# Patient Record
Sex: Female | Born: 1955 | Race: White | Hispanic: No | Smoking: Never smoker
Health system: Southern US, Community
[De-identification: ages and names within clinical notes are randomized; demographics above are authoritative.]

## PROBLEM LIST (undated history)

## (undated) DIAGNOSIS — F988 Other specified behavioral and emotional disorders with onset usually occurring in childhood and adolescence: Secondary | ICD-10-CM

## (undated) DIAGNOSIS — F419 Anxiety disorder, unspecified: Secondary | ICD-10-CM

## (undated) DIAGNOSIS — D649 Anemia, unspecified: Secondary | ICD-10-CM

## (undated) DIAGNOSIS — R51 Headache: Secondary | ICD-10-CM

## (undated) DIAGNOSIS — I341 Nonrheumatic mitral (valve) prolapse: Secondary | ICD-10-CM

## (undated) DIAGNOSIS — T7840XA Allergy, unspecified, initial encounter: Secondary | ICD-10-CM

## (undated) DIAGNOSIS — E079 Disorder of thyroid, unspecified: Secondary | ICD-10-CM

## (undated) HISTORY — DX: Allergy, unspecified, initial encounter: T78.40XA

## (undated) HISTORY — DX: Headache: R51

## (undated) HISTORY — PX: ABDOMINAL HYSTERECTOMY: SHX81

## (undated) HISTORY — DX: Anxiety disorder, unspecified: F41.9

## (undated) HISTORY — DX: Other specified behavioral and emotional disorders with onset usually occurring in childhood and adolescence: F98.8

## (undated) HISTORY — DX: Anemia, unspecified: D64.9

## (undated) HISTORY — DX: Nonrheumatic mitral (valve) prolapse: I34.1

## (undated) HISTORY — DX: Disorder of thyroid, unspecified: E07.9

---

## 1998-05-13 ENCOUNTER — Ambulatory Visit (HOSPITAL_COMMUNITY): Admission: RE | Admit: 1998-05-13 | Discharge: 1998-05-13 | Payer: Self-pay | Admitting: *Deleted

## 1998-08-18 ENCOUNTER — Ambulatory Visit (HOSPITAL_COMMUNITY): Admission: RE | Admit: 1998-08-18 | Discharge: 1998-08-18 | Payer: Self-pay | Admitting: *Deleted

## 1999-04-04 ENCOUNTER — Encounter: Payer: Self-pay | Admitting: *Deleted

## 1999-04-04 ENCOUNTER — Ambulatory Visit (HOSPITAL_COMMUNITY): Admission: RE | Admit: 1999-04-04 | Discharge: 1999-04-04 | Payer: Self-pay | Admitting: *Deleted

## 1999-06-16 ENCOUNTER — Other Ambulatory Visit: Admission: RE | Admit: 1999-06-16 | Discharge: 1999-06-16 | Payer: Self-pay | Admitting: *Deleted

## 2000-05-07 ENCOUNTER — Encounter: Payer: Self-pay | Admitting: *Deleted

## 2000-05-07 ENCOUNTER — Ambulatory Visit (HOSPITAL_COMMUNITY): Admission: RE | Admit: 2000-05-07 | Discharge: 2000-05-07 | Payer: Self-pay | Admitting: *Deleted

## 2000-08-02 ENCOUNTER — Other Ambulatory Visit: Admission: RE | Admit: 2000-08-02 | Discharge: 2000-08-02 | Payer: Self-pay | Admitting: *Deleted

## 2001-05-28 ENCOUNTER — Encounter: Payer: Self-pay | Admitting: *Deleted

## 2001-05-28 ENCOUNTER — Ambulatory Visit (HOSPITAL_COMMUNITY): Admission: RE | Admit: 2001-05-28 | Discharge: 2001-05-28 | Payer: Self-pay | Admitting: *Deleted

## 2001-08-13 ENCOUNTER — Other Ambulatory Visit: Admission: RE | Admit: 2001-08-13 | Discharge: 2001-08-13 | Payer: Self-pay | Admitting: *Deleted

## 2002-07-02 ENCOUNTER — Encounter: Payer: Self-pay | Admitting: *Deleted

## 2002-07-02 ENCOUNTER — Ambulatory Visit (HOSPITAL_COMMUNITY): Admission: RE | Admit: 2002-07-02 | Discharge: 2002-07-02 | Payer: Self-pay | Admitting: *Deleted

## 2002-07-16 ENCOUNTER — Encounter: Payer: Self-pay | Admitting: Family Medicine

## 2002-07-16 ENCOUNTER — Ambulatory Visit (HOSPITAL_COMMUNITY): Admission: RE | Admit: 2002-07-16 | Discharge: 2002-07-16 | Payer: Self-pay | Admitting: Family Medicine

## 2002-07-16 ENCOUNTER — Encounter: Admission: RE | Admit: 2002-07-16 | Discharge: 2002-07-16 | Payer: Self-pay | Admitting: Family Medicine

## 2002-09-16 ENCOUNTER — Other Ambulatory Visit: Admission: RE | Admit: 2002-09-16 | Discharge: 2002-09-16 | Payer: Self-pay | Admitting: *Deleted

## 2003-02-02 ENCOUNTER — Other Ambulatory Visit: Admission: RE | Admit: 2003-02-02 | Discharge: 2003-02-02 | Payer: Self-pay | Admitting: Obstetrics and Gynecology

## 2003-09-17 ENCOUNTER — Encounter: Admission: RE | Admit: 2003-09-17 | Discharge: 2003-09-17 | Payer: Self-pay | Admitting: Obstetrics and Gynecology

## 2003-10-12 ENCOUNTER — Inpatient Hospital Stay (HOSPITAL_COMMUNITY): Admission: RE | Admit: 2003-10-12 | Discharge: 2003-10-14 | Payer: Self-pay | Admitting: Obstetrics and Gynecology

## 2003-10-12 ENCOUNTER — Encounter (INDEPENDENT_AMBULATORY_CARE_PROVIDER_SITE_OTHER): Payer: Self-pay | Admitting: *Deleted

## 2003-10-19 ENCOUNTER — Ambulatory Visit (HOSPITAL_BASED_OUTPATIENT_CLINIC_OR_DEPARTMENT_OTHER): Admission: RE | Admit: 2003-10-19 | Discharge: 2003-10-19 | Payer: Self-pay | Admitting: Urology

## 2004-03-14 ENCOUNTER — Other Ambulatory Visit: Admission: RE | Admit: 2004-03-14 | Discharge: 2004-03-14 | Payer: Self-pay | Admitting: Obstetrics and Gynecology

## 2004-09-19 ENCOUNTER — Ambulatory Visit (HOSPITAL_COMMUNITY): Admission: RE | Admit: 2004-09-19 | Discharge: 2004-09-19 | Payer: Self-pay | Admitting: Obstetrics and Gynecology

## 2005-06-14 ENCOUNTER — Other Ambulatory Visit: Admission: RE | Admit: 2005-06-14 | Discharge: 2005-06-14 | Payer: Self-pay | Admitting: Obstetrics and Gynecology

## 2005-11-08 ENCOUNTER — Other Ambulatory Visit: Admission: RE | Admit: 2005-11-08 | Discharge: 2005-11-08 | Payer: Self-pay | Admitting: Obstetrics and Gynecology

## 2006-01-21 ENCOUNTER — Ambulatory Visit (HOSPITAL_COMMUNITY): Admission: RE | Admit: 2006-01-21 | Discharge: 2006-01-21 | Payer: Self-pay | Admitting: Orthopedic Surgery

## 2006-06-19 ENCOUNTER — Ambulatory Visit (HOSPITAL_COMMUNITY): Admission: RE | Admit: 2006-06-19 | Discharge: 2006-06-19 | Payer: Self-pay | Admitting: Obstetrics and Gynecology

## 2007-03-26 ENCOUNTER — Ambulatory Visit: Payer: Self-pay | Admitting: Family Medicine

## 2007-06-30 ENCOUNTER — Ambulatory Visit: Payer: Self-pay | Admitting: Family Medicine

## 2007-06-30 LAB — CONVERTED CEMR LAB
Albumin: 3.9 g/dL (ref 3.5–5.2)
Alkaline Phosphatase: 64 units/L (ref 39–117)
Basophils Relative: 1 % (ref 0.0–1.0)
Blood in Urine, dipstick: NEGATIVE
Calcium: 8.9 mg/dL (ref 8.4–10.5)
Chloride: 106 meq/L (ref 96–112)
Cholesterol: 208 mg/dL (ref 0–200)
Eosinophils Absolute: 0.2 10*3/uL (ref 0.0–0.6)
Eosinophils Relative: 3.1 % (ref 0.0–5.0)
GFR calc non Af Amer: 111 mL/min
Glucose, Bld: 81 mg/dL (ref 70–99)
HCT: 35.2 % — ABNORMAL LOW (ref 36.0–46.0)
Hemoglobin: 12.4 g/dL (ref 12.0–15.0)
Ketones, urine, test strip: NEGATIVE
Monocytes Relative: 6.6 % (ref 3.0–11.0)
Neutro Abs: 4.2 10*3/uL (ref 1.4–7.7)
Platelets: 251 10*3/uL (ref 150–400)
Protein, U semiquant: NEGATIVE
RBC: 3.71 M/uL — ABNORMAL LOW (ref 3.87–5.11)
RDW: 12.3 % (ref 11.5–14.6)
TSH: 3.23 microintl units/mL (ref 0.35–5.50)
Total Bilirubin: 0.9 mg/dL (ref 0.3–1.2)
Total Protein: 6.7 g/dL (ref 6.0–8.3)
Triglycerides: 59 mg/dL (ref 0–149)
pH: 7

## 2007-07-01 ENCOUNTER — Ambulatory Visit (HOSPITAL_COMMUNITY): Admission: RE | Admit: 2007-07-01 | Discharge: 2007-07-01 | Payer: Self-pay | Admitting: Family Medicine

## 2007-07-07 ENCOUNTER — Ambulatory Visit: Payer: Self-pay | Admitting: Family Medicine

## 2007-07-07 DIAGNOSIS — R51 Headache: Secondary | ICD-10-CM

## 2007-07-07 DIAGNOSIS — J309 Allergic rhinitis, unspecified: Secondary | ICD-10-CM | POA: Insufficient documentation

## 2007-07-07 DIAGNOSIS — D649 Anemia, unspecified: Secondary | ICD-10-CM | POA: Insufficient documentation

## 2007-07-07 DIAGNOSIS — E039 Hypothyroidism, unspecified: Secondary | ICD-10-CM | POA: Insufficient documentation

## 2007-07-07 DIAGNOSIS — F988 Other specified behavioral and emotional disorders with onset usually occurring in childhood and adolescence: Secondary | ICD-10-CM | POA: Insufficient documentation

## 2007-07-07 DIAGNOSIS — R519 Headache, unspecified: Secondary | ICD-10-CM | POA: Insufficient documentation

## 2007-07-08 ENCOUNTER — Encounter (INDEPENDENT_AMBULATORY_CARE_PROVIDER_SITE_OTHER): Payer: Self-pay | Admitting: *Deleted

## 2007-07-28 ENCOUNTER — Telehealth: Payer: Self-pay | Admitting: Family Medicine

## 2007-10-21 ENCOUNTER — Telehealth: Payer: Self-pay | Admitting: Family Medicine

## 2007-12-20 ENCOUNTER — Encounter: Admission: RE | Admit: 2007-12-20 | Discharge: 2007-12-20 | Payer: Self-pay | Admitting: Otolaryngology

## 2008-04-01 ENCOUNTER — Telehealth: Payer: Self-pay | Admitting: *Deleted

## 2008-04-01 ENCOUNTER — Telehealth: Payer: Self-pay | Admitting: Family Medicine

## 2008-07-01 ENCOUNTER — Ambulatory Visit (HOSPITAL_COMMUNITY): Admission: RE | Admit: 2008-07-01 | Discharge: 2008-07-01 | Payer: Self-pay | Admitting: Family Medicine

## 2008-07-21 ENCOUNTER — Telehealth: Payer: Self-pay | Admitting: Family Medicine

## 2008-08-03 ENCOUNTER — Ambulatory Visit: Payer: Self-pay | Admitting: Family Medicine

## 2008-08-03 LAB — CONVERTED CEMR LAB
AST: 22 units/L (ref 0–37)
Albumin: 3.8 g/dL (ref 3.5–5.2)
Alkaline Phosphatase: 52 units/L (ref 39–117)
Basophils Absolute: 0 10*3/uL (ref 0.0–0.1)
Bilirubin Urine: NEGATIVE
Bilirubin, Direct: 0.2 mg/dL (ref 0.0–0.3)
Calcium: 9.1 mg/dL (ref 8.4–10.5)
Chloride: 105 meq/L (ref 96–112)
Glucose, Bld: 93 mg/dL (ref 70–99)
HCT: 35.6 % — ABNORMAL LOW (ref 36.0–46.0)
Hemoglobin: 12.1 g/dL (ref 12.0–15.0)
LDL Cholesterol: 108 mg/dL — ABNORMAL HIGH (ref 0–99)
Monocytes Absolute: 0.6 10*3/uL (ref 0.1–1.0)
Neutro Abs: 4.6 10*3/uL (ref 1.4–7.7)
Nitrite: NEGATIVE
Potassium: 3.9 meq/L (ref 3.5–5.1)
RBC: 3.73 M/uL — ABNORMAL LOW (ref 3.87–5.11)
Specific Gravity, Urine: 1.015 (ref 1.000–1.03)
Total Bilirubin: 0.9 mg/dL (ref 0.3–1.2)
Urine Glucose: NEGATIVE mg/dL
Urobilinogen, UA: 0.2 (ref 0.0–1.0)
WBC: 8 10*3/uL (ref 4.5–10.5)

## 2008-08-10 ENCOUNTER — Ambulatory Visit: Payer: Self-pay | Admitting: Family Medicine

## 2008-08-10 DIAGNOSIS — R5381 Other malaise: Secondary | ICD-10-CM | POA: Insufficient documentation

## 2008-09-06 ENCOUNTER — Ambulatory Visit: Payer: Self-pay | Admitting: Family Medicine

## 2008-09-07 DIAGNOSIS — F411 Generalized anxiety disorder: Secondary | ICD-10-CM | POA: Insufficient documentation

## 2008-09-07 DIAGNOSIS — R002 Palpitations: Secondary | ICD-10-CM | POA: Insufficient documentation

## 2008-09-24 ENCOUNTER — Telehealth: Payer: Self-pay | Admitting: *Deleted

## 2008-10-11 ENCOUNTER — Encounter: Payer: Self-pay | Admitting: Family Medicine

## 2009-07-12 ENCOUNTER — Ambulatory Visit (HOSPITAL_COMMUNITY): Admission: RE | Admit: 2009-07-12 | Discharge: 2009-07-12 | Payer: Self-pay | Admitting: Obstetrics and Gynecology

## 2009-08-11 ENCOUNTER — Encounter: Payer: Self-pay | Admitting: Family Medicine

## 2009-08-12 ENCOUNTER — Ambulatory Visit: Payer: Self-pay | Admitting: Family Medicine

## 2009-08-12 LAB — CONVERTED CEMR LAB
BUN: 11 mg/dL (ref 6–23)
Basophils Relative: 1 % (ref 0.0–3.0)
Bilirubin, Direct: 0.1 mg/dL (ref 0.0–0.3)
CO2: 31 meq/L (ref 19–32)
Creatinine, Ser: 0.7 mg/dL (ref 0.4–1.2)
Eosinophils Absolute: 0.2 10*3/uL (ref 0.0–0.7)
HDL: 67.6 mg/dL (ref >39.00)
Hemoglobin, Urine: NEGATIVE
Ketones, ur: NEGATIVE mg/dL
Leukocytes, UA: NEGATIVE
Lymphs Abs: 2.6 10*3/uL (ref 0.7–4.0)
MCV: 96.4 fL (ref 78.0–100.0)
Monocytes Relative: 6.5 % (ref 3.0–12.0)
Neutro Abs: 3.2 10*3/uL (ref 1.4–7.7)
Nitrite: NEGATIVE
Platelets: 248 10*3/uL (ref 150.0–400.0)
Potassium: 4.4 meq/L (ref 3.5–5.1)
RBC: 3.76 M/uL — ABNORMAL LOW (ref 3.87–5.11)
RDW: 11.4 % — ABNORMAL LOW (ref 11.5–14.6)
Sodium: 141 meq/L (ref 135–145)
Specific Gravity, Urine: 1.01 (ref 1.000–1.030)
TSH: 4.25 microintl units/mL (ref 0.35–5.50)
Total CHOL/HDL Ratio: 3
Total Protein, Urine: NEGATIVE mg/dL
Total Protein: 7.1 g/dL (ref 6.0–8.3)
Urine Glucose: NEGATIVE mg/dL
Urobilinogen, UA: 0.2 (ref 0.0–1.0)
VLDL: 15.6 mg/dL (ref 0.0–40.0)
WBC: 6.5 10*3/uL (ref 4.5–10.5)

## 2009-08-22 ENCOUNTER — Encounter (INDEPENDENT_AMBULATORY_CARE_PROVIDER_SITE_OTHER): Payer: Self-pay | Admitting: *Deleted

## 2009-08-22 ENCOUNTER — Ambulatory Visit: Payer: Self-pay | Admitting: Family Medicine

## 2009-08-23 ENCOUNTER — Encounter (INDEPENDENT_AMBULATORY_CARE_PROVIDER_SITE_OTHER): Payer: Self-pay | Admitting: *Deleted

## 2009-08-29 ENCOUNTER — Encounter (INDEPENDENT_AMBULATORY_CARE_PROVIDER_SITE_OTHER): Payer: Self-pay | Admitting: *Deleted

## 2009-12-27 ENCOUNTER — Telehealth: Payer: Self-pay | Admitting: Family Medicine

## 2010-01-30 ENCOUNTER — Telehealth: Payer: Self-pay | Admitting: Family Medicine

## 2010-04-13 ENCOUNTER — Telehealth: Payer: Self-pay | Admitting: Family Medicine

## 2010-06-19 ENCOUNTER — Telehealth: Payer: Self-pay | Admitting: Family Medicine

## 2010-06-20 ENCOUNTER — Telehealth: Payer: Self-pay | Admitting: Family Medicine

## 2010-07-03 ENCOUNTER — Telehealth: Payer: Self-pay | Admitting: Family Medicine

## 2010-07-13 ENCOUNTER — Ambulatory Visit (HOSPITAL_COMMUNITY): Admission: RE | Admit: 2010-07-13 | Discharge: 2010-07-13 | Payer: Self-pay | Admitting: Family Medicine

## 2010-08-16 ENCOUNTER — Telehealth: Payer: Self-pay | Admitting: Family Medicine

## 2010-08-28 ENCOUNTER — Ambulatory Visit: Payer: Self-pay | Admitting: Family Medicine

## 2010-08-28 LAB — CONVERTED CEMR LAB
AST: 25 units/L (ref 0–37)
Albumin: 3.9 g/dL (ref 3.5–5.2)
Alkaline Phosphatase: 61 units/L (ref 39–117)
BUN: 16 mg/dL (ref 6–23)
Basophils Absolute: 0 10*3/uL (ref 0.0–0.1)
Basophils Relative: 0.5 % (ref 0.0–3.0)
Calcium: 8.9 mg/dL (ref 8.4–10.5)
Creatinine, Ser: 0.7 mg/dL (ref 0.4–1.2)
Direct LDL: 101.9 mg/dL
Eosinophils Absolute: 0.3 10*3/uL (ref 0.0–0.7)
GFR calc non Af Amer: 88.8 mL/min (ref >60)
Glucose, Bld: 82 mg/dL (ref 70–99)
HCT: 32.4 % — ABNORMAL LOW (ref 36.0–46.0)
Ketones, ur: NEGATIVE mg/dL
Lymphs Abs: 2.8 10*3/uL (ref 0.7–4.0)
MCV: 95.8 fL (ref 78.0–100.0)
Monocytes Absolute: 0.5 10*3/uL (ref 0.1–1.0)
Monocytes Relative: 7.3 % (ref 3.0–12.0)
RDW: 13.2 % (ref 11.5–14.6)
Sodium: 140 meq/L (ref 135–145)
TSH: 3.43 microintl units/mL (ref 0.35–5.50)
Total Bilirubin: 0.9 mg/dL (ref 0.3–1.2)
Total CHOL/HDL Ratio: 3
Total Protein, Urine: NEGATIVE mg/dL
VLDL: 26.2 mg/dL (ref 0.0–40.0)
WBC: 6.9 10*3/uL (ref 4.5–10.5)
pH: 5.5 (ref 5.0–8.0)

## 2010-09-04 ENCOUNTER — Ambulatory Visit: Payer: Self-pay | Admitting: Family Medicine

## 2010-09-04 ENCOUNTER — Encounter: Payer: Self-pay | Admitting: Family Medicine

## 2010-09-06 ENCOUNTER — Encounter: Payer: Self-pay | Admitting: Family Medicine

## 2010-09-07 LAB — CONVERTED CEMR LAB
Folate: 15.5 ng/mL
Saturation Ratios: 22 % (ref 20.0–50.0)
Transferrin: 282.7 mg/dL (ref 212.0–360.0)

## 2010-09-08 ENCOUNTER — Telehealth: Payer: Self-pay | Admitting: Family Medicine

## 2010-10-12 ENCOUNTER — Telehealth: Payer: Self-pay | Admitting: Family Medicine

## 2010-11-07 NOTE — Progress Notes (Signed)
Summary: ritalin refill  Phone Note Refill Request Message from:  Patient on August 16, 2010 9:54 AM  Refills Requested: Medication #1:  RITALIN 10 MG TABS take 2 tabs in the morning and one tab at noon fill in one month. Initial call taken by: Kern Reap CMA Duncan Dull),  August 16, 2010 9:54 AM    Prescriptions: RITALIN 10 MG TABS (METHYLPHENIDATE HCL) take 2 tabs in the morning and one tab at noon fill in one month  #90 x 0   Entered by:   Kern Reap CMA (AAMA)   Authorized by:   Roderick Pee MD   Signed by:   Kern Reap CMA (AAMA) on 08/16/2010   Method used:   Print then Give to Patient   RxID:   [id number]

## 2010-11-07 NOTE — Medication Information (Signed)
Summary: Prior Authorization and Approval for Methylphenidate  Prior Authorization and Approval for Methylphenidate   Imported By: Maryln Gottron 09/08/2010 15:16:40  _____________________________________________________________________  External Attachment:    Type:   Image     Comment:   External Document

## 2010-11-07 NOTE — Progress Notes (Signed)
Summary: REFILL  Phone Note Refill Request Message from:  Fax from Pharmacy  Refills Requested: Medication #1:  LIDEX 0.05 % CREA apply at bedtime. [facility][phone]  Initial call taken by: Warnell Forester,  December 27, 2009 8:46 AM    Prescriptions: LIDEX 0.05 % CREA (FLUOCINONIDE) apply at bedtime  #60 gr x 3   Entered by:   Kern Reap CMA (AAMA)   Authorized by:   Roderick Pee MD   Signed by:   Kern Reap CMA (AAMA) on 12/27/2009   Method used:   Electronically to        [facility]* (mail-order)       [address] [place], [place]  [address]       Ph: [phone]       Fax: [phone]   RxID:   [id number]

## 2010-11-07 NOTE — Progress Notes (Signed)
Summary: ativan refill  Phone Note From Pharmacy   Caller: [facility]* Summary of Call: patient is requesting a refill of ativan.  is this okay to fill? Initial call taken by: Kern Reap CMA Duncan Dull),  April 13, 2010 5:02 PM  Follow-up for Phone Call        .her last physical was in November 2010.......... okay to refill medications until next physical Follow-up by: Roderick Pee MD,  April 14, 2010 8:28 AM  Additional Follow-up for Phone Call Additional follow up Details #1::        rx faxed Additional Follow-up by: Kern Reap CMA Duncan Dull),  April 14, 2010 3:07 PM    Prescriptions: ATIVAN 1 MG  TABS (LORAZEPAM) uad, as needed  #100 x 1   Entered by:   Kern Reap CMA (AAMA)   Authorized by:   Roderick Pee MD   Signed by:   Kern Reap CMA (AAMA) on 04/14/2010   Method used:   Printed then faxed to ...       [facility] (mail-order)       [address] [place], [place]  [address]       Ph: [phone]       Fax: [phone]   RxID:   [id number]

## 2010-11-07 NOTE — Assessment & Plan Note (Signed)
Summary: CPX // RS   Vital Signs:  Patient profile:   55 year old female Height:      64.5 inches Weight:      159 pounds BMI:     26.97 Temp:     98.6 degrees F oral Pulse rate:   76 / minute Pulse rhythm:   regular BP sitting:   112 / 84  (left arm) Cuff size:   regular  Vitals Entered By: Alfred Levins, CMA (September 04, 2010 3:54 PM) CC: cpx, no pap   CC:  cpx and no pap.  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  Sleep dysfunction, and ADD.  She takes Ativan 1 mg at bedtime.  If she does not take that.  She will not sleep.  She also takes her Levoxyl 50 micrograms daily for hypothyroidism.  Her TSH level is 3.43, therefore, continue above dose.  She takes Celexa 20 mg nightly and Corgard 20 mg p.r.n. for palpitations.  She says the palpitations occur about once or twice every couple weeks.  She is content to take it p.r.n. Corgard.  She also takes methylphenidate 10 mg two tabs in the morning one tab at noon.  This helped to focus and concentrate and get through the day.  She also takes over-the-counter antihistamines for allergic rhinitis.  I counseled her about her hormone replacement therapy.  She said this hormone replacement therapy now for about 6 years.  I asked her to consult with a women's health initiative study.........Marland Kitchen bottom line is she should be on hormones for about 3 years and get off pain.  She is intolerant of salicylates.  It makes her itch.  She therefore does not take an aspirin tablet daily.  She gets routine eye care, dental care, BSE monthly, annual mammography, colonoscopy, normal.  Tetanus 2001, and today, seasonal flu shot 2011  Her migraines have virtually disappeared  Current Medications (verified): 1)  Femring 0.05 Mg/24hr  Ring (Estradiol Acetate) .... Change  Every 90 Days 2)  Levoxyl 50 Mcg  Tabs (Levothyroxine Sodium) .Marland Kitchen.. 1 Tab Once Daily; Dispense As Written-No Generic 3)  Mvi 4)  B-Complex .... Take 1 Tablet By Mouth Once A Day 5)  Ca & Mag. 6)  Fexofenadine Hcl 180 Mg  Tabs (Fexofenadine Hcl) .... Take 1 Tablet By Mouth Once A Day 7)  Butalbital-Asa-Caffeine 50-325-40 Mg  Caps (Butalbital-Aspirin-Caffeine) .... Uad, Prn For Migraines 8)  Ativan 1 Mg  Tabs (Lorazepam) .... Uad, As Needed 9)  Celexa 20 Mg Tabs (Citalopram Hydrobromide) .Marland Kitchen.. 1 Tab @ Bedtime 10)  Methylphenidate Hcl 10 Mg Tabs (Methylphenidate Hcl) .... Take Two Tabs in The Morning and One Tab At [facility] in Two Months 11)  Corgard 20 Mg Tabs (Nadolol) .... One Once Daily 12)  Lidex 0.05 % Crea (Fluocinonide) .... Apply At Bedtime 13)  Methylphenidate Hcl 10 Mg Tabs (Methylphenidate Hcl) .... Take 2 Tabs in The Morning and One Tab At [facility] 14)  Ritalin 10 Mg Tabs (Methylphenidate Hcl) .... Take 2 Tabs in The Morning and One Tab At [facility] in One Month  Allergies (verified): 1)  ! Penicillin V Potassium (Penicillin V Potassium)  Past History:  Past medical, surgical, family and social histories (including risk factors) reviewed, and no changes noted (except as noted below).  Past Medical History: Reviewed history from 09/06/2008 and  no changes required. Allergic rhinitis Anemia-NOS Headache Hypothyroidism ADD MVP Anxiety  Past Surgical History: Hysterectomy  Family History: Reviewed history and no changes required.  Social History: Reviewed history from 07/07/2007 and no changes required. Occupation:RN Divorced Never Smoked Alcohol use-no Drug use-no  Review of Systems      See HPI  Physical Exam  General:  Well-developed,well-nourished,in no acute distress; alert,appropriate and cooperative throughout examination Head:  Normocephalic and atraumatic without obvious abnormalities. No apparent alopecia or balding. Eyes:  No corneal or conjunctival  inflammation noted. EOMI. Perrla. Funduscopic exam benign, without hemorrhages, exudates or papilledema. Vision grossly normal. Ears:  External ear exam shows no significant lesions or deformities.  Otoscopic examination reveals clear canals, tympanic membranes are intact bilaterally without bulging, retraction, inflammation or discharge. Hearing is grossly normal bilaterally. Nose:  External nasal examination shows no deformity or inflammation. Nasal mucosa are pink and moist without lesions or exudates. Mouth:  Oral mucosa and oropharynx without lesions or exudates.  Teeth in good repair. Neck:  No deformities, masses, or tenderness noted. Chest Wall:  No deformities, masses, or tenderness noted. Breasts:  No mass, nodules, thickening, tenderness, bulging, retraction, inflamation, nipple discharge or skin changes noted.   Lungs:  Normal respiratory effort, chest expands symmetrically. Lungs are clear to auscultation, no crackles or wheezes. Heart:  Normal rate and regular rhythm. S1 and S2 normal without gallop, murmur, click, rub or other extra sounds. Abdomen:  Bowel sounds positive,abdomen soft and non-tender without masses, organomegaly or hernias noted. Msk:  No deformity or scoliosis noted of thoracic or lumbar spine.   Pulses:  R and L carotid,radial,femoral,dorsalis pedis and posterior tibial pulses are full and equal bilaterally Extremities:  No clubbing, cyanosis, edema, or deformity noted with normal full range of motion of all joints.   Neurologic:  No cranial nerve deficits noted. Station and gait are normal. Plantar reflexes are down-going bilaterally. DTRs are symmetrical throughout. Sensory, motor and coordinative functions appear intact. Skin:  Intact without suspicious lesions or rashes Cervical Nodes:  No lymphadenopathy noted Axillary Nodes:  No palpable lymphadenopathy Inguinal Nodes:  No significant adenopathy Psych:  Cognition and judgment appear intact. Alert and  cooperative with normal attention span and concentration. No apparent delusions, illusions, hallucinations   Impression & Recommendations:  Problem # 1:  PALPITATIONS, OCCASIONAL (ICD-785.1) Assessment Unchanged  Her updated medication list for this problem includes:    Corgard 20 Mg Tabs (Nadolol) ..... One once daily  Orders: Prescription Created Electronically [phone]) EKG w/ Interpretation (93000)  Problem # 2:  ADD (ICD-314.00) Assessment: Improved  Orders: Prescription Created Electronically [phone]) EKG w/ Interpretation (93000)  Problem # 3:  HYPOTHYROIDISM (ICD-244.9) Assessment: Improved  Her updated medication list for this problem includes:    Levoxyl 50 Mcg Tabs (Levothyroxine sodium) .Marland Kitchen... 1 tab once daily; dispense as written-no generic  Orders: Prescription Created Electronically [phone]) EKG w/ Interpretation (93000)  Problem # 4:  HEADACHE (ICD-784.0) Assessment: Improved  Her updated medication list for this problem includes:    Butalbital-asa-caffeine 50-325-40 Mg Caps (Butalbital-aspirin-caffeine) ..... Uad, prn for migraines    Corgard 20 Mg Tabs (Nadolol) ..... One once daily  Orders: Prescription Created Electronically [phone])  Problem # 5:  PHYSICAL EXAMINATION (ICD-V70.0) Assessment: Unchanged  Orders: Prescription Created Electronically [phone])  Complete Medication List: 1)  Femring 0.05 Mg/24hr Ring (Estradiol acetate) .... Change every 90 days 2)  Levoxyl 50 Mcg Tabs (Levothyroxine sodium) .Marland Kitchen.. 1 tab once daily; dispense as written-no generic 3)  Mvi  4)  B-complex  .... Take 1 tablet by mouth once a day 5)  Ca & Mag.  6)  Fexofenadine Hcl 180 Mg Tabs (Fexofenadine hcl) .... Take 1 tablet by mouth once a day 7)  Butalbital-asa-caffeine 50-325-40 Mg Caps (Butalbital-aspirin-caffeine) .... Uad, prn for migraines 8)  Ativan 1 Mg Tabs (Lorazepam) .... Uad, as needed 9)  Celexa 20 Mg Tabs (Citalopram hydrobromide) .Marland Kitchen.. 1 tab @ bedtime 10)   Methylphenidate Hcl 10 Mg Tabs (Methylphenidate hcl) .... Take two tabs in the morning and one tab at noon  fill in two months 11)  Corgard 20 Mg Tabs (Nadolol) .... One once daily 12)  Lidex 0.05 % Crea (Fluocinonide) .... Apply at bedtime 13)  Methylphenidate Hcl 10 Mg Tabs (Methylphenidate hcl) .... Take 2 tabs in the morning and one tab at noon 14)  Ritalin 10 Mg Tabs (Methylphenidate hcl) .... Take 2 tabs in the morning and one tab at noon fill in one month  Other Orders: Venipuncture ([address]) TLB-B12 + Folate Pnl (60454_09811-B14/NWG) TLB-IBC Pnl (Iron/FE;Transferrin) (83550-IBC) Specimen Handling (95621) Tdap => 33yrs IM (30865) Admin 1st Vaccine (78469)  Patient Instructions: 1)  Please schedule a follow-up appointment in 1 year. 2)  It is important that you exercise regularly at least 20 minutes 5 times a week. If you develop chest pain, have severe difficulty breathing, or feel very tired , stop exercising immediately and seek medical attention. 3)  Schedule your mammogram. 4)  Schedule a colonoscopy/sigmoidoscopy to help detect colon cancer. 5)  Take calcium +Vitamin D daily. 6)  Take an Aspirin every day. Prescriptions: RITALIN 10 MG TABS (METHYLPHENIDATE HCL) take 2 tabs in the morning and one tab at noon fill in one month  #90 x 0   Entered and Authorized by:   Roderick Pee MD   Signed by:   Roderick Pee MD on 09/04/2010   Method used:   Print then Give to Patient   RxID:   [id number] METHYLPHENIDATE HCL 10 MG TABS (METHYLPHENIDATE HCL) take 2 tabs in the morning and one tab at noon  #90 x 0   Entered and Authorized by:   Roderick Pee MD   Signed by:   Roderick Pee MD on 09/04/2010   Method used:   Print then Give to Patient   RxID:   [id number] METHYLPHENIDATE HCL 10 MG TABS (METHYLPHENIDATE HCL) take two tabs in the morning and one tab at noon  fill in two months  #90 x 0   Entered and Authorized by:   Roderick Pee MD   Signed by:   Roderick Pee MD on 09/04/2010   Method used:   Print then Give to Patient   RxID:   [id number] ATIVAN 1 MG  TABS (LORAZEPAM) uad, as needed  #100 x 3   Entered and Authorized by:   Roderick Pee MD   Signed by:   Roderick Pee MD on 09/04/2010   Method used:   Print then Give to Patient   RxID:   [id number] CORGARD 20 MG TABS (NADOLOL) one once daily  #50 x 1   Entered and Authorized by:   Roderick Pee MD   Signed by:   Roderick Pee MD on 09/04/2010   Method used:   Electronically to        [facility]* (mail-order)       [address] [place], [place]  [address]  Ph: [phone]       Fax: [phone]   RxID:   [id number] CELEXA 20 MG TABS (CITALOPRAM HYDROBROMIDE) 1 tab @ bedtime  #100 x 3   Entered and Authorized by:   Roderick Pee MD   Signed by:   Roderick Pee MD on 09/04/2010   Method used:   Electronically to        [facility]* (mail-order)       [address] [place], [place]  [address]       Ph: [phone]       Fax: [phone]   RxID:   [id number] LEVOXYL 50 MCG  TABS (LEVOTHYROXINE SODIUM) 1 tab once daily; dispense as written-no generic  #100 x 3   Entered and Authorized by:   Roderick Pee MD   Signed by:   Roderick Pee MD on 09/04/2010   Method used:   Electronically to        [facility]* (mail-order)       [address] [place], [place]  [address]       Ph: [phone]       Fax: [phone]   RxID:   [id number]    Orders Added: 1)  Prescription Created Electronically [G8553] 2)  Est. Patient 40-64 years [99396] 3)  Venipuncture [10626] 4)  TLB-B12 + Folate Pnl [82746_82607-B12/FOL] 5)  TLB-IBC Pnl (Iron/FE;Transferrin) [83550-IBC] 6)  Specimen Handling [99000] 7)  EKG w/ Interpretation [93000] 8)  Tdap => 79yrs IM [90715] 9)  Admin 1st Vaccine [94854]   Immunizations Administered:  Tetanus Vaccine:    Vaccine Type: Tdap    Site: left deltoid     Mfr: GlaxoSmithKline    Dose: 0.5 ml    Route: IM    Given by: Kern Reap CMA (AAMA)    Exp. Date: 07/27/2012    Lot #: [id number]    Physician counseled: yes   Immunizations Administered:  Tetanus Vaccine:    Vaccine Type: Tdap    Site: left deltoid    Mfr: GlaxoSmithKline    Dose: 0.5 ml    Route: IM    Given by: Kern Reap CMA (AAMA)    Exp. Date: 07/27/2012    Lot #: [id number]    Physician counseled: yes

## 2010-11-07 NOTE — Progress Notes (Signed)
Summary: refills  Phone Note Refill Request Message from:  Fax from Pharmacy on June 19, 2010 12:56 PM  Refills Requested: Medication #1:  CELEXA 20 MG TABS 1 tab @ bedtime  Medication #2:  CORGARD 20 MG TABS one once daily  Medication #3:  LEVOXYL 50 MCG  TABS 1 tab once daily; dispense as written-no generic Initial call taken by: Kern Reap CMA Duncan Dull),  June 19, 2010 12:56 PM    Prescriptions: CORGARD 20 MG TABS (NADOLOL) one once daily  #100 x 2   Entered by:   Kern Reap CMA (AAMA)   Authorized by:   Roderick Pee MD   Signed by:   Kern Reap CMA (AAMA) on 06/19/2010   Method used:   Electronically to        [facility]* (mail-order)       [address] [place], [place]  [address]       Ph: [phone]       Fax: [phone]   RxID:   [id number] CELEXA 20 MG TABS (CITALOPRAM HYDROBROMIDE) 1 tab @ bedtime  #100 x 2   Entered by:   Kern Reap CMA (AAMA)   Authorized by:   Roderick Pee MD   Signed by:   Kern Reap CMA (AAMA) on 06/19/2010   Method used:   Electronically to        [facility]* (mail-order)       [address] [place], [place]  [address]       Ph: [phone]       Fax: [phone]   RxID:   [id number] LEVOXYL 50 MCG  TABS (LEVOTHYROXINE SODIUM) 1 tab once daily; dispense as written-no generic  #100 x 2   Entered by:   Kern Reap CMA (AAMA)   Authorized by:   Roderick Pee MD   Signed by:   Kern Reap CMA (AAMA) on 06/19/2010   Method used:   Electronically to        [facility]* (mail-order)       [address] [place], [place]  [address]       Ph: [phone]       Fax: [phone]   RxID:   [id number]

## 2010-11-07 NOTE — Progress Notes (Signed)
Summary: Refills  Phone Note From Pharmacy   Summary of Call: Faxed  back to [facility] Initial call taken by: Kathrynn Speed CMA,  June 20, 2010 9:54 AM    Prescriptions: CELEXA 20 MG TABS (CITALOPRAM HYDROBROMIDE) 1 tab @ bedtime  #100 x 1   Entered by:   Kathrynn Speed CMA   Authorized by:   Roderick Pee MD   Signed by:   Kathrynn Speed CMA on 06/20/2010   Method used:   Historical   RxID:   [id number] CORGARD 20 MG TABS (NADOLOL) one once daily  #100 x 1   Entered by:   Kathrynn Speed CMA   Authorized by:   Roderick Pee MD   Signed by:   Kathrynn Speed CMA on 06/20/2010   Method used:   Historical   RxID:   [id number] LEVOXYL 50 MCG  TABS (LEVOTHYROXINE SODIUM) 1 tab once daily; dispense as written-no generic  #100 x 1   Entered by:   Kathrynn Speed CMA   Authorized by:   Roderick Pee MD   Signed by:   Kathrynn Speed CMA on 06/20/2010   Method used:   Historical   RxID:   [id number]

## 2010-11-07 NOTE — Progress Notes (Signed)
Summary: Call when RX for signed  Phone Note Call from Patient   Summary of Call: Pt requested Ritalin refilled last fill date was 01/30/10 for 90 tabs Call when ready to be picked up on cell phone: [phone] Initial call taken by: Kathrynn Speed CMA,  July 03, 2010 10:18 AM  Follow-up for Phone Call        Called pt lft msg that Rx is ready for pick-up Follow-up by: Kathrynn Speed CMA,  July 03, 2010 11:36 AM    Prescriptions: RITALIN 10 MG TABS (METHYLPHENIDATE HCL) take 2 tabs in the morning and one tab at noon fill in one month  #90 x 0   Entered by:   Kathrynn Speed CMA   Authorized by:   Roderick Pee MD   Signed by:   Kathrynn Speed CMA on 07/03/2010   Method used:   Print then Give to Patient   RxID:   [id number]   Appended Document: Call when RX for signed [facility] at [facility] calling to clarify if other Rxs were given since Rx says fill in one month.  No other Rxs were given.  Okay to fill.  Appended Document: Call when RX for signed spoke to pharmacy

## 2010-11-07 NOTE — Progress Notes (Signed)
Summary: hctz rx  Phone Note Call from Patient Call back at Home Phone [phone]   Summary of Call: patient is calling for a rx for HCTZ Initial call taken by: Kern Reap CMA Duncan Dull),  September 08, 2010 5:56 PM  Follow-up for Phone Call        hydrochlorothiazide 25 mg, dispense 100 tablets, directions one p.o., q.a.m., refills x 3 Follow-up by: Roderick Pee MD,  September 11, 2010 8:46 AM    New/Updated Medications: HYDROCHLOROTHIAZIDE 25 MG TABS (HYDROCHLOROTHIAZIDE) Take one tablet by mouth every morning Prescriptions: HYDROCHLOROTHIAZIDE 25 MG TABS (HYDROCHLOROTHIAZIDE) Take one tablet by mouth every morning  #100 x 3   Entered by:   Kathlene November LPN   Authorized by:   Roderick Pee MD   Signed by:   Kathlene November LPN on 29/56/2130   Method used:   Electronically to        [facility]* (mail-order)       [address] [place], [place]  [address]       Ph: [phone]       Fax: [phone]   RxID:   [id number]

## 2010-11-07 NOTE — Progress Notes (Signed)
Summary: ritalin refill  Phone Note Call from Patient   Summary of Call: patient is calling for a refill of ritalin cell number is [phone] Initial call taken by: Kern Reap CMA Duncan Dull),  January 30, 2010 9:50 AM  Follow-up for Phone Call        rx ready for pick up Follow-up by: Kern Reap CMA Duncan Dull),  January 30, 2010 11:34 AM  Additional Follow-up for Phone Call Additional follow up Details #1::        rx ready for pick up Additional Follow-up by: Kern Reap CMA Duncan Dull),  January 30, 2010 11:48 AM    New/Updated Medications: METHYLPHENIDATE HCL 10 MG TABS (METHYLPHENIDATE HCL) take 2 tabs in the morning and one tab at noon RITALIN 10 MG TABS (METHYLPHENIDATE HCL) take 2 tabs in the morning and one tab at noon fill in one month Prescriptions: RITALIN 10 MG TABS (METHYLPHENIDATE HCL) take 2 tabs in the morning and one tab at noon fill in one month  #90 x 0   Entered by:   Kern Reap CMA (AAMA)   Authorized by:   Roderick Pee MD   Signed by:   Kern Reap CMA (AAMA) on 01/30/2010   Method used:   Print then Give to Patient   RxID:   [id number] METHYLPHENIDATE HCL 10 MG TABS (METHYLPHENIDATE HCL) take 2 tabs in the morning and one tab at noon  #90 x 0   Entered by:   Kern Reap CMA (AAMA)   Authorized by:   Roderick Pee MD   Signed by:   Kern Reap CMA (AAMA) on 01/30/2010   Method used:   Print then Give to Patient   RxID:   [id number] METHYLPHENIDATE HCL 10 MG TABS (METHYLPHENIDATE HCL) take two tabs in the morning and one tab at noon  fill in two months  #90 x 0   Entered by:   Kern Reap CMA (AAMA)   Authorized by:   Roderick Pee MD   Signed by:   Kern Reap CMA (AAMA) on 01/30/2010   Method used:   Print then Give to Patient   RxID:   [id number]

## 2011-01-04 NOTE — Progress Notes (Signed)
Summary: dx codes  Phone Note Call from Patient   Caller: Patient Reason for Call: Talk to Doctor Summary of Call: patient is calling because her insurance will not cover certain things from her physical unless the dx codes are changed.  the things that are not covered are - hepatic function, thyroid pannel, metabolic pannel, and ekg.  ok to leave a message [phone] Initial call taken by: Kern Reap CMA Duncan Dull),  October 12, 2010 3:44 PM  Follow-up for Phone Call        [facility] Follow-up by: Trixie Dredge,  October 25, 2010 3:06 PM

## 2011-01-15 ENCOUNTER — Other Ambulatory Visit: Payer: Self-pay | Admitting: *Deleted

## 2011-01-15 MED ORDER — FLUOCINONIDE 0.05 % EX CREA: TOPICAL_CREAM | Freq: Two times a day (BID) | CUTANEOUS | Status: AC

## 2011-02-20 NOTE — Assessment & Plan Note (Signed)
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   Patient states she has felt well until Monday when she woke up and felt  vertigo.  She felt a sensation of the room spinning.  She went to work  anyway.  Blood pressure was elevated in 150/98 range.  The vertigo  finally dissipated last night, but she has checked her blood pressure a  couple of times, and they were markedly elevated.  She does have a  family history of hypertension, so thought she might be developing  hypertension.  She has had no antecedent problems prior to the vertigo.  She had normal blood pressures in past.  She had a physical in August  2007 by Dr. Providence [name] that was normal.   Has no history of any hearing loss, etc., with the vertigo.  Never had  vertigo in the past.   PAST MEDICAL HISTORY:  1. Had childbirth x2.  2. Fracture of left hand hamate playing golf.  Healed spontaneously      without surgery.  3. BTL.  4. TH and BSO and AMP repair in 2004.  5. She has had a history of MVP, currently asymptomatic.  6. Hypothyroidism for which she takes Levoxyl 50 mcg daily.  7. Anemia.  Had a workup.  She always runs a hemoglobin of 11 or 12.      No etiology was found, and is unresponsive to iron.  8. She has had some arrhythmias secondary to the MVP, currently      asymptomatic off caffeine.  9. She has occasional UTI, nothing chronic.  10.She said she had some issues with ADD and depression.  The ADD is      worse now that she is having to work full time, and having to focus      and concentrate.  In the past, she has taken some Lexapro and      Wellbutrin, but had side effects from the medicine, and  got off it.      She says she is currently okay, although she is in the process of      going through another divorce.   ALLERGIES:  PENICILLIN.  SHE HAD A CHILDHOOD ALLERGY.   She does not smoke.  Only drinks an occasional drink of alcohol.  No  drug use.   CURRENT MEDICATIONS:  1. Levoxyl 50 mcg as noted above.  2. Xanax 0.25.  Cautioned about that.  Would rather switch to Ativan.   REVIEW OF SYSTEMS:  ENT:  Other than vertigo, negative.  CARDIOPULMONARY:  Negative.   SOCIAL HISTORY:  She is married, and in the process of divorce.  Lives  here in [place].  Two children.  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   FAMILY HISTORY:  Dad is 36 and has hypothyroidism, hypertension,  osteoarthritis.  Mother died at 53 of COPD, smoker, and depression.  No  brothers.  Sister has hypertension, hypothyroidism, history of  depression.   PHYSICAL EVALUATION:  Height 5 feet 5-1/4 inches.  Weight 144.  BP  118/79 by Marylene Land.  120/80 by me.  Pulse 70 and regular.  She is  afebrile.  GENERAL:  She is a well-developed, well-nourished, white female in no  acute distress.  CARDIAC:  Exam was negative.  Cannot auscultate any murmurs, rubs, nor  gallops.  BP as noted above.   IMPRESSION:  1. Vertigo.  Now that vertigo has resolved, blood pressure has gone      back to normal.  So, I think the blood pressure elevation was      secondary to the vertigo.  2. Anxiety.  Switch from Xanax to Ativan 1.5 nightly p.r.n.  3. History of attention deficit disorder.  Start Ritalin 10 mg p.o.      b.i.d.  Phone call 2 weeks for phone followup to see how she is      doing.  She is also to return in August for CPX.     Jeffrey A. Tawanna Cooler, MD  Electronically Signed    JAT/MedQ  DD: 03/26/2007  DT: 03/26/2007  Job #: [id number]

## 2011-02-21 ENCOUNTER — Other Ambulatory Visit: Payer: Self-pay | Admitting: Family Medicine

## 2011-02-21 MED ORDER — METHYLPHENIDATE HCL 10 MG PO TABS: ORAL_TABLET | ORAL | Status: DC

## 2011-02-21 NOTE — Telephone Encounter (Signed)
Pt req refill of Ritalin 10 mg.

## 2011-02-22 NOTE — Telephone Encounter (Signed)
rx ready for pick up and Left message on machine for patient 

## 2011-02-23 NOTE — Discharge Summary (Signed)
[name], [name]                           ACCOUNT NO.:  [id number]   MEDICAL RECORD NO.:  [id number]                   PATIENT TYPE:  INP   LOCATION:  0449                                 FACILITY:  [facility]   PHYSICIAN:  Michelle L. Vincente Poli, M.D.            DATE OF BIRTH:  1955/10/30   DATE OF ADMISSION:  10/12/2003  DATE OF DISCHARGE:  10/14/2003                                 DISCHARGE SUMMARY   ADMISSION DIAGNOSES:  1. Uterine prolapse.  2. Cystocele, rectocele.  3. Stress urinary incontinence.   DISCHARGE DIAGNOSES:  1. Uterine prolapse.  2. Cystocele, rectocele.  3. Stress urinary incontinence.   HOSPITAL COURSE:  The patient is a 55 year old, gravida 2, para 2, who  underwent LAVH, BSO, A&P repair, and sling on the day of admission. Surgery  was uncomplicated. EBL was 500 cc. The patient did very well  postoperatively. She did have some mild urinary retention and on  postoperative day #2 was sent home with a leg bag per Dr. Retta Diones. By  postoperative day #2, the patient remained afebrile, she had stable vital  signs, she was ambulating, had good pain control, and was given ibuprofen  600 mg to take as needed for pain as well as Tylox. She was advised no heavy  lifting, no driving for one week, and to call should any vaginal bleeding,  nausea, vomiting, abdominal pain, or fever greater than 100.5. Of note, on  postoperative day #1, her hematocrit was 27.1.                                               Michelle L. Vincente Poli, M.D.    [name] [name]  D:  10/27/2003  T:  10/28/2003  Job:  [id number]

## 2011-02-23 NOTE — Op Note (Signed)
[name] [name], [name] [name]                           ACCOUNT NO.:  [id number]   MEDICAL RECORD NO.:  [id number]                   PATIENT TYPE:  INP   LOCATION:  X011                                 FACILITY:  [facility]   PHYSICIAN:  Bertram Millard. Dahlstedt, M.D.          DATE OF BIRTH:  1956/03/01   DATE OF PROCEDURE:  10/12/2003  DATE OF DISCHARGE:                                 OPERATIVE REPORT   PREOPERATIVE DIAGNOSIS:  Stress urinary incontinence.   POSTOPERATIVE DIAGNOSIS:  Stress urinary incontinence.   OPERATION/PROCEDURE:  SPARC pubovaginal tape placement.   SURGEON:  Bertram Millard. Dahlstedt, M.D.   FIRST ASSISTANT:  Michelle L. Vincente Poli, M.D.   ANESTHESIA:  General endotracheal anesthesia.   COMPLICATIONS:  None.   BRIEF HISTORY:  This nice 55 year old lady is scheduled for a cystectomy,  anterior and posterior repair by Dr. Vincente Poli. She has typical stress urinary  incontinence.  Evaluation in the office revealed no significant symptoms or  findings of an unstable bladder.  She does have a cystocele and rectocele.  The patient desires management of her stress incontinence, as it affects her  daily life and necessitates pad placement.  She is scheduled for a SPARC  procedure.  She is aware of the risks and complications of this procedure.  She desires to proceed.   DESCRIPTION OF PROCEDURE:  The patient was administered a general  endotracheal anesthesia.  Laparoscopically-assisted vaginal hysterectomy was  performed by Dr. Vincente Poli.  Additionally, anterior repair was performed.  At  this point I was called in for the surgery.  Her bladder had been drained  with the catheter.  The anterior vaginal mucosa was infiltrated with 1%  lidocaine with epinephrine just distal to the cystocele repair.  An incision  was made from just proximal to the urethral meatus to the bladder neck.  Vaginal mucosa was elevated on either side with sharp dissection.  Two stab  incisions were made just off the  midline bilaterally in the suprapubic area.  The needles were then passed through these incisions, guided just along the  posterior pubic symphysis bilaterally and out through either side of the  previously constructed vaginal incision.  Cystoscopy revealed no evidence of  impingement from the left needle on the bladder.  However, the right needle  was evident just underneath the bladder mucosa.  It was replaced and  cystoscopic exam revealed no evidence of impingement on the bladder.  At  this point the [facility] tape was placed on the needles.  It was then pulled  anteriorly, leaving about 1 cm of slack underneath the urethra.  The plastic  sheathing was removed from the [facility] tape.  It cradled the mid urethra quite  nicely and there was plenty of mucosa circumferentially around this tape to  close over.  Again, the bladder was inspected and no injuries were seen.  The mucosa was intact.  The tape was cut off just  below the skin edges and  Dermabond was used to reapproximate the stab wounds.  The vaginal mucosa was  closed with an interrupted 2-0 Vicryl placed in simple interrupted fashion.  A catheter will be replaced.   The patient tolerated the procedure well.  Dr. Vincente Poli then commenced with  the posterior repair.                                               Bertram Millard. Dahlstedt, M.D.    SMD/MEDQ  D:  10/12/2003  T:  10/12/2003  Job:  [id number]   cc:   Marcelino Duster L. Vincente Poli, M.D.  [address], [address] [place]  [place] [address]  Fax: [phone]

## 2011-02-23 NOTE — Op Note (Signed)
[name] [name], [name] [name]                           ACCOUNT NO.:  [id number]   MEDICAL RECORD NO.:  [id number]                   PATIENT TYPE:  INP   LOCATION:  0449                                 FACILITY:  [facility]   PHYSICIAN:  Michelle L. Vincente Poli, M.D.            DATE OF BIRTH:  [birth year]   DATE OF PROCEDURE:  10/12/2003  DATE OF DISCHARGE:  10/14/2003                                 OPERATIVE REPORT   PREOPERATIVE DIAGNOSES:  1. Cystocele.  2. Rectocele.  3. Uterine prolapse.   POSTOPERATIVE DIAGNOSES:  1. Cystocele.  2. Rectocele.  3. Uterine prolapse.   OPERATION/PROCEDURE:  1. Laparoscopic-assisted vaginal hysteroscopy.  2. Bilateral salpingo-oophorectomy.  3. Anterior and posterior repair.  4. Pubovaginal sling.   SURGEON:  Michelle L. Vincente Poli, M.D.   ASSISTANT:  Raynald Kemp, M.D.   ESTIMATED BLOOD LOSS:  500 mL.   FINDINGS:  Cystocele, rectocele and uterine prolapse.   DESCRIPTION OF PROCEDURE:  The patient was taken to the operating room.  She  was intubated without difficulty.  She was then placed in the lithotomy  position.  The abdomen, vagina and vulva were prepped and draped in the  usual sterile fashion.  Foley catheter was inserted in the bladder.  Attention was directed to the abdomen where an infraumbilical incision was  made.  Veress needle was inserted without difficulty.  The pneumoperitoneum  was achieved without any problems.  Veress needle was removed after  pneumoperitoneum was achieved and the 10/11 mm trocar was inserted without  any difficulty.  The patient was placed in Trendelenburg position and under  direct visualization a small suprapubic 5 mm trocar was inserted without any  difficulty.  We then performed abdominal/pelvic exploration.  Upper abdomen  was normal.  Uterus appeared normal and adnexa was normal.   I then initially started on the right side of the pelvis.  I grasped the  right ovary and tube, identified the infundibulopelvic  ligament and assured  that the ureter was well out of our way of our advancing.  Using the gyrus  instrument, I burned and then transected the infundibulopelvic ligament.  This was done on the right side and then on the left side in similar  fashion.  It was done with excellent hemostasis.  We then worked our way  down along the mesosalpinx in a similar fashion, all the way down to the  round ligament with good hemostasis.  Once we reached this part, we then  removed the gyrus instrument, released the peritoneum and proceeded  vaginally with the rest of the surgery.   We noted a grade 2-3 uterine prolapse, grade 2 cystocele and a grade 2-3  rectocele.  The cervix was grasped with a tenaculum.  A circumferential  incision was made around the cervix and then we entered the posterior cul-de-  sac without any difficulty.  We then dissected the bladder right anteriorly  and then entered the anterior cul-de-sac in a similar fashion with sharp  dissection.  We then worked our way up using Heaney clamps.  By first  __________making the uterosacrals/cardinal complexes on each side.  Each  pedicle was cut and suture ligated using 0 Vicryl suture.  Using the Heaney  clamps, we clamped just the side of the uterus and the broad ligament, we  worked our way up the uterus until we reached the level of our triple  pedicles.  The uterus was retroflexed and the remainder of the specimen was  removed and __________ was placed across the tissue on either side.  Using  curved Heaney clamps, specimen was cut and suture ligated using 0 Vicryl  suture.  We then inspected all pedicles and hemostasis was noted.  The  posterior cuff was closed in a continuous running locked suture using 2-0  Vicryl suture.  We then closed the posterior two-thirds of the cuff using a  continuous running locked sutures of 2-0 Vicryl suture.  We then proceeded  with our anterior repair by placing Allis clamps on either side of the   vaginal cuff and dissecting the vaginal epithelium anteriorly making a  midline incision all the way up to the UV angle.  We then dissected the  vesicovaginal fascia free, reduced the cystocele and closed the cystocele  using a pursestring suture using 2-0 Vicryl suture.  After the cystocele was  reduced, we then trimmed the redundant vaginal epithelium.  At that point,  the posterior two-thirds of this part was then closed using interrupted 2-0  Vicryl suture.   At this point, Dr. Retta Diones scrubbed in and performed his pubovaginal  sling.  After he was completed, I then proceeded with the rectocele repair.   Allis clamps were placed at 5 and 7 o'clock.  A V-shaped incision was made  over the perineum and then I actually worked my way up in the midline, made  a midline incision all the way up posteriorly.  Reduced the rectocele using  sharp and blunt dissection, closed the rectocele using interrupted 2-0  Vicryl suture moving distally to proximally, trimming the redundant vaginal  epithelium and closing the vaginal epithelium using continuous running  locked suture of 2-0 Vicryl suture.   At the end of the procedure, there was no vaginal bleeding noted.  The  vaginal packing with Estrace cream was inserted into the vagina.  At this  point we then turned our attention back to the abdomen where  pneumoperitoneum was then performed again.  All pedicles inspected.  Irrigation was performed and hemostasis was noted.  After the  pneumoperitoneum was released, the incisions were closed using 3-0 Vicryl  interrupteds.  The patient tolerated the procedure well.  All sponge, lap  and instrument counts x2.  She went to the recovery room in stable  condition.                                               Michelle L. Vincente Poli, M.D.    [name]  D:  12/02/2003  T:  12/02/2003  Job:  [id number]

## 2011-02-23 NOTE — Op Note (Signed)
NAMEJALISA, [name] [name]                           ACCOUNT NO.:  [id number]   MEDICAL RECORD NO.:  [id number]                   PATIENT TYPE:  AMB   LOCATION:  NESC                                 FACILITY:  [facility]   PHYSICIAN:  Bertram Millard. Dahlstedt, M.D.          DATE OF BIRTH:  Nov 13, 1955   DATE OF PROCEDURE:  10/19/2003  DATE OF DISCHARGE:                                 OPERATIVE REPORT   PREOPERATIVE DIAGNOSIS:  Status post SPARC sling with urinary retention.   POSTOPERATIVE DIAGNOSIS:  Status post [facility] sling with urinary retention.   OPERATION/PROCEDURE:  1. Cystoscopy.  2. Urethral dilation/manipulation.   SURGEON:  Bertram Millard. Dahlstedt, M.D.   ANESTHESIA:  General with LMA.   COMPLICATIONS:  None.   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  She has recovered well but has failed voiding trials x2.  She was  seen in the office yesterday and had significant urinary residuals.  She did  urinate some.  It was felt necessary to perform a cystoscopy, urethral  manipulation and possible open revision of her SPARC sling in this window of  time when the sling had not been fixed by the patient's own tissue.  We  scheduled this for today.  She is aware of the risks and complications of  reoperation and desires to proceed.   DESCRIPTION OF PROCEDURE:  The patient was administered a general anesthesia  using LMA.  She was placed in the dorsal lithotomy position.  Genitalia and  perineum were prepped and draped.  A 22-French panendoscope was advanced  into the bladder.  The bladder appeared normal.  No injury was seen to the  bladder wall.  The urethra was scoped.  There was a slight upward tilt to  the urethra.  I then placed a 24-French female sound.  Using this, I put  pressure posteriorly on the urethra to free the sling up somewhat.  This was  done on two or three  different passes of the sound.  With the bladder full,  there was an easy flow of urine out.  There was a good flow and the Crede  maneuver improved this flow significantly.  I looked back in and the  urethrovesical angle had changed.  At this point, I felt there was no need  to open up the anterior vaginal wall and loosen up the sling.  A 14-French  Foley catheter was placed and hook to dependent drainage.   The patient tolerated the procedure well.  She was awakened and taken to the  PACU in stable condition.  Bertram Millard. Dahlstedt, M.D.    SMD/MEDQ  D:  10/19/2003  T:  10/19/2003  Job:  [id number]

## 2011-03-01 ENCOUNTER — Telehealth: Payer: Self-pay | Admitting: Family Medicine

## 2011-03-01 MED ORDER — METHYLPHENIDATE HCL 10 MG PO TABS: ORAL_TABLET | ORAL | Status: DC

## 2011-03-02 NOTE — Telephone Encounter (Signed)
rx ready for pick up.  patient  Is aware 

## 2011-03-08 ENCOUNTER — Other Ambulatory Visit: Payer: Self-pay | Admitting: *Deleted

## 2011-03-08 MED ORDER — NADOLOL 20 MG PO TABS: 20.0000 mg | ORAL_TABLET | Freq: Every day | ORAL | Status: DC

## 2011-06-14 ENCOUNTER — Other Ambulatory Visit: Payer: Self-pay | Admitting: Family Medicine

## 2011-06-28 ENCOUNTER — Other Ambulatory Visit: Payer: Self-pay | Admitting: Family Medicine

## 2011-07-17 ENCOUNTER — Other Ambulatory Visit: Payer: Self-pay

## 2011-07-17 NOTE — Telephone Encounter (Signed)
Pt request a refill on her ritalin.

## 2011-07-17 NOTE — Telephone Encounter (Signed)
Okay to refill x 3 months????????? When is her  annual physical.  Due

## 2011-07-18 ENCOUNTER — Other Ambulatory Visit: Payer: Self-pay | Admitting: Family Medicine

## 2011-07-18 DIAGNOSIS — Z1231 Encounter for screening mammogram for malignant neoplasm of breast: Secondary | ICD-10-CM

## 2011-07-18 MED ORDER — METHYLPHENIDATE HCL 10 MG PO TABS: ORAL_TABLET | ORAL | Status: DC

## 2011-07-18 NOTE — Telephone Encounter (Signed)
Pt's physcial is due in November.

## 2011-07-19 NOTE — Telephone Encounter (Signed)
Left a message for pt that prescriptions are ready for pickup.

## 2011-08-02 ENCOUNTER — Ambulatory Visit (HOSPITAL_COMMUNITY)
Admission: RE | Admit: 2011-08-02 | Discharge: 2011-08-02 | Disposition: A | Discharge status: Home / Self Care | Payer: BC Managed Care – PPO | Source: Ambulatory Visit | Attending: Family Medicine | Admitting: Family Medicine

## 2011-08-02 DIAGNOSIS — Z1231 Encounter for screening mammogram for malignant neoplasm of breast: Secondary | ICD-10-CM | POA: Insufficient documentation

## 2011-08-14 ENCOUNTER — Other Ambulatory Visit: Payer: Self-pay | Admitting: Family Medicine

## 2011-08-31 ENCOUNTER — Other Ambulatory Visit (INDEPENDENT_AMBULATORY_CARE_PROVIDER_SITE_OTHER): Payer: BC Managed Care – PPO

## 2011-08-31 DIAGNOSIS — E039 Hypothyroidism, unspecified: Secondary | ICD-10-CM

## 2011-08-31 DIAGNOSIS — D649 Anemia, unspecified: Secondary | ICD-10-CM

## 2011-08-31 DIAGNOSIS — Z0000 Encounter for general adult medical examination without abnormal findings: Secondary | ICD-10-CM

## 2011-08-31 DIAGNOSIS — Z79899 Other long term (current) drug therapy: Secondary | ICD-10-CM

## 2011-08-31 LAB — POCT URINALYSIS DIPSTICK
Glucose, UA: NEGATIVE
Ketones, UA: NEGATIVE
Leukocytes, UA: NEGATIVE
Nitrite, UA: NEGATIVE
Protein, UA: NEGATIVE
Urobilinogen, UA: 0.2
pH, UA: 7.5

## 2011-08-31 LAB — HEPATIC FUNCTION PANEL
ALT: 23 U/L (ref 0–35)
AST: 27 U/L (ref 0–37)
Total Bilirubin: 0.6 mg/dL (ref 0.3–1.2)

## 2011-08-31 LAB — LIPID PANEL
Cholesterol: 215 mg/dL — ABNORMAL HIGH (ref 0–200)
HDL: 74.8 mg/dL (ref >39.00)
Triglycerides: 135 mg/dL (ref 0.0–149.0)
VLDL: 27 mg/dL (ref 0.0–40.0)

## 2011-08-31 LAB — BASIC METABOLIC PANEL
CO2: 30 mEq/L (ref 19–32)
Calcium: 9.5 mg/dL (ref 8.4–10.5)
Chloride: 104 mEq/L (ref 96–112)
Creatinine, Ser: 0.6 mg/dL (ref 0.4–1.2)
GFR: 101.37 mL/min (ref >60.00)
Glucose, Bld: 88 mg/dL (ref 70–99)
Sodium: 142 mEq/L (ref 135–145)

## 2011-08-31 LAB — CBC WITH DIFFERENTIAL/PLATELET
Basophils Absolute: 0 10*3/uL (ref 0.0–0.1)
Basophils Relative: 0.7 % (ref 0.0–3.0)
HCT: 34.7 % — ABNORMAL LOW (ref 36.0–46.0)
Lymphocytes Relative: 34.4 % (ref 12.0–46.0)
Monocytes Absolute: 0.4 10*3/uL (ref 0.1–1.0)
Neutro Abs: 3.3 10*3/uL (ref 1.4–7.7)
Platelets: 277 10*3/uL (ref 150.0–400.0)
WBC: 6.2 10*3/uL (ref 4.5–10.5)

## 2011-08-31 LAB — TSH: TSH: 2.88 u[IU]/mL (ref 0.35–5.50)

## 2011-09-05 ENCOUNTER — Other Ambulatory Visit: Payer: Self-pay

## 2011-09-12 ENCOUNTER — Encounter: Payer: Self-pay | Admitting: Family Medicine

## 2011-09-12 ENCOUNTER — Ambulatory Visit (INDEPENDENT_AMBULATORY_CARE_PROVIDER_SITE_OTHER): Payer: BC Managed Care – PPO | Admitting: Family Medicine

## 2011-09-12 DIAGNOSIS — F411 Generalized anxiety disorder: Secondary | ICD-10-CM

## 2011-09-12 DIAGNOSIS — E039 Hypothyroidism, unspecified: Secondary | ICD-10-CM

## 2011-09-12 DIAGNOSIS — R51 Headache: Secondary | ICD-10-CM

## 2011-09-12 DIAGNOSIS — F988 Other specified behavioral and emotional disorders with onset usually occurring in childhood and adolescence: Secondary | ICD-10-CM

## 2011-09-12 DIAGNOSIS — R002 Palpitations: Secondary | ICD-10-CM

## 2011-09-12 MED ORDER — HYDROCHLOROTHIAZIDE 25 MG PO TABS: 25.0000 mg | ORAL_TABLET | Freq: Every day | ORAL | Status: DC

## 2011-09-12 MED ORDER — LEVOXYL 50 MCG PO TABS: 50.0000 ug | ORAL_TABLET | Freq: Every day | ORAL | Status: DC

## 2011-09-12 MED ORDER — LORAZEPAM 1 MG PO TABS: 1.0000 mg | ORAL_TABLET | Freq: Every day | ORAL | Status: DC

## 2011-09-12 MED ORDER — METHYLPHENIDATE HCL 20 MG PO TABS: ORAL_TABLET | ORAL | Status: DC

## 2011-09-12 MED ORDER — CITALOPRAM HYDROBROMIDE 20 MG PO TABS: 20.0000 mg | ORAL_TABLET | Freq: Every day | ORAL | Status: DC

## 2011-09-12 MED ORDER — BUTALBITAL-ASA-CAFFEINE 50-325-40 MG PO CAPS: 1.0000 | ORAL_CAPSULE | Freq: Two times a day (BID) | ORAL | Status: DC | PRN

## 2011-09-12 NOTE — Patient Instructions (Signed)
Continue your current medications except increase the Ritalin to 20 mg b.i.d. Return one year or sooner if any problems remember to do a thorough breast exam monthly and get her mammogram yearly

## 2011-09-12 NOTE — Progress Notes (Signed)
  Subjective:    Patient ID: [name] [name], female    DOB: 1956/07/16, 55 y.o.   MRN: [id number]  HPI [name] [name] is a 54 year old female, who comes in today for evaluation of mild depression, adult ADD, mild hypertension, hypothyroidism, and a history of migraine headaches.  Her medication list was reviewed and there have been no changes.  She takes 20 mg of Ritalin in the morning and 10 mg in the afternoon.  However, the afternoon dose seems to be wearing off rather quickly.  We discussed for his options.  She wants to increase the dose to 20 b.i.d.  She would also like a refill on her Fiorinal for migraine headaches.  She has a migraine once or twice a month.  We talked about other options.  She is content to stay with the Fiorinal.  She has a history of a cystic lesion in her left breast.  She gets annual mammography.  She also does BSE at home.  Tetanus booster 2011, seasonal flu shot 2012   Review of Systems  Neurological: Positive for headaches.       Objective:   Physical Exam  Constitutional: She appears well-developed and well-nourished.  HENT:  Head: Normocephalic and atraumatic.  Right Ear: External ear normal.  Left Ear: External ear normal.  Nose: Nose normal.  Mouth/Throat: Oropharynx is clear and moist.  Eyes: EOM are normal. Pupils are equal, round, and reactive to light.  Neck: Normal range of motion. Neck supple. No thyromegaly present.  Cardiovascular: Normal rate, regular rhythm, normal heart sounds and intact distal pulses.  Exam reveals no gallop and no friction rub.   No murmur heard. Pulmonary/Chest: Effort normal and breath sounds normal.  Abdominal: Soft. Bowel sounds are normal. She exhibits no distension and no mass. There is no tenderness. There is no rebound.  Genitourinary:       Bilateral breast exam shows a normal right breast, left breast.  There is a walnut-sized cystic lesion in the left breast at the 6 o'clock position.  A half an inch below the nipple.   It is soft, rubbery movable, and is the same size as previously.  Mammogram in September.  Normal  Musculoskeletal: Normal range of motion.  Lymphadenopathy:    She has no cervical adenopathy.  Neurological: She is alert. She has normal reflexes. No cranial nerve deficit. She exhibits normal muscle tone. Coordination normal.  Skin: Skin is warm and dry.  Psychiatric: She has a normal mood and affect. Her behavior is normal. Judgment and thought content normal.          Assessment & Plan:  Healthy female.  Mild depression continue Celexa 20 mg nightly along with Ativan 1 mg.  Nightly  Adult ADD.  Increase Ritalin to 20 mg b.i.d.  Hypothyroidism.  Continue Synthroid 50 mcg daily.  Migraine headaches.  Continue Yarnall p.r.n., along with the Corgard 20 mg daily, which also helps with the palpitations.  She sometimes gets because of a history of MVP.  Return one year, sooner if any problem

## 2011-11-16 ENCOUNTER — Other Ambulatory Visit: Payer: Self-pay | Admitting: *Deleted

## 2011-11-16 MED ORDER — NADOLOL 20 MG PO TABS: 20.0000 mg | ORAL_TABLET | Freq: Every day | ORAL | Status: DC

## 2012-01-30 ENCOUNTER — Other Ambulatory Visit: Payer: Self-pay | Admitting: Obstetrics and Gynecology

## 2012-04-08 ENCOUNTER — Other Ambulatory Visit: Payer: Self-pay | Admitting: *Deleted

## 2012-04-08 MED ORDER — METHYLPHENIDATE HCL 20 MG PO TABS: 20.0000 mg | ORAL_TABLET | Freq: Two times a day (BID) | ORAL | Status: DC

## 2012-07-29 ENCOUNTER — Other Ambulatory Visit: Payer: Self-pay | Admitting: Family Medicine

## 2012-07-29 DIAGNOSIS — Z1231 Encounter for screening mammogram for malignant neoplasm of breast: Secondary | ICD-10-CM

## 2012-07-30 ENCOUNTER — Other Ambulatory Visit: Payer: Self-pay | Admitting: Family Medicine

## 2012-08-13 ENCOUNTER — Ambulatory Visit (HOSPITAL_COMMUNITY)
Admission: RE | Admit: 2012-08-13 | Discharge: 2012-08-13 | Disposition: A | Discharge status: Home / Self Care | Payer: BC Managed Care – PPO | Source: Ambulatory Visit | Attending: Family Medicine | Admitting: Family Medicine

## 2012-08-13 DIAGNOSIS — Z1231 Encounter for screening mammogram for malignant neoplasm of breast: Secondary | ICD-10-CM

## 2012-09-05 IMAGING — MG MM DIGITAL SCREENING
4 series · 4 of 4 positions shown · non-contrast
Comparison: Prior studies.

DG SCREEN MAMMOGRAM BILATERAL
Bilateral CC and MLO view(s) were taken.

DIGITAL SCREENING MAMMOGRAM WITH CAD:

[R CC]
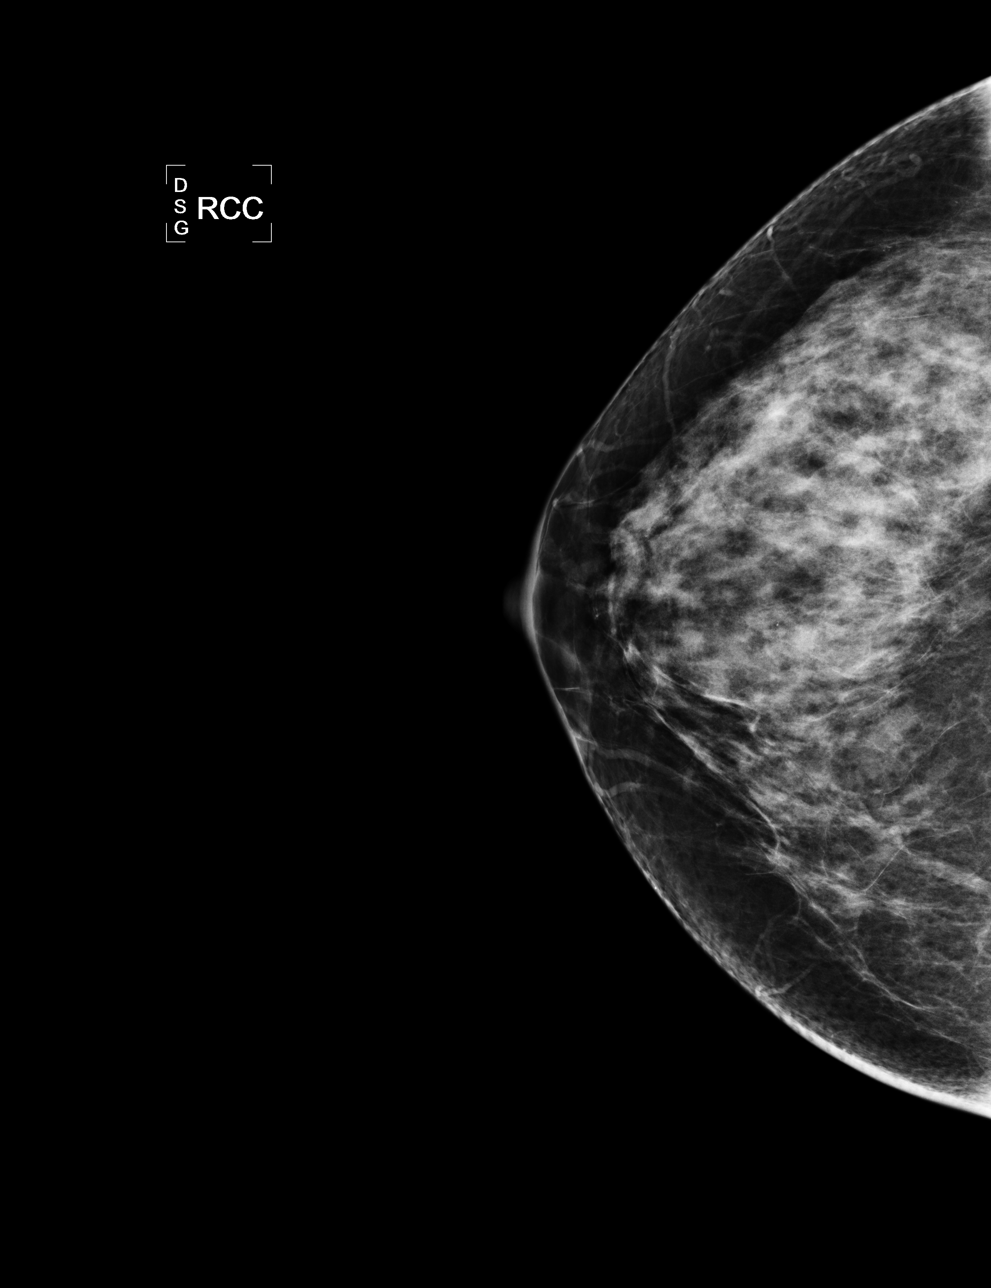

[R MLO]
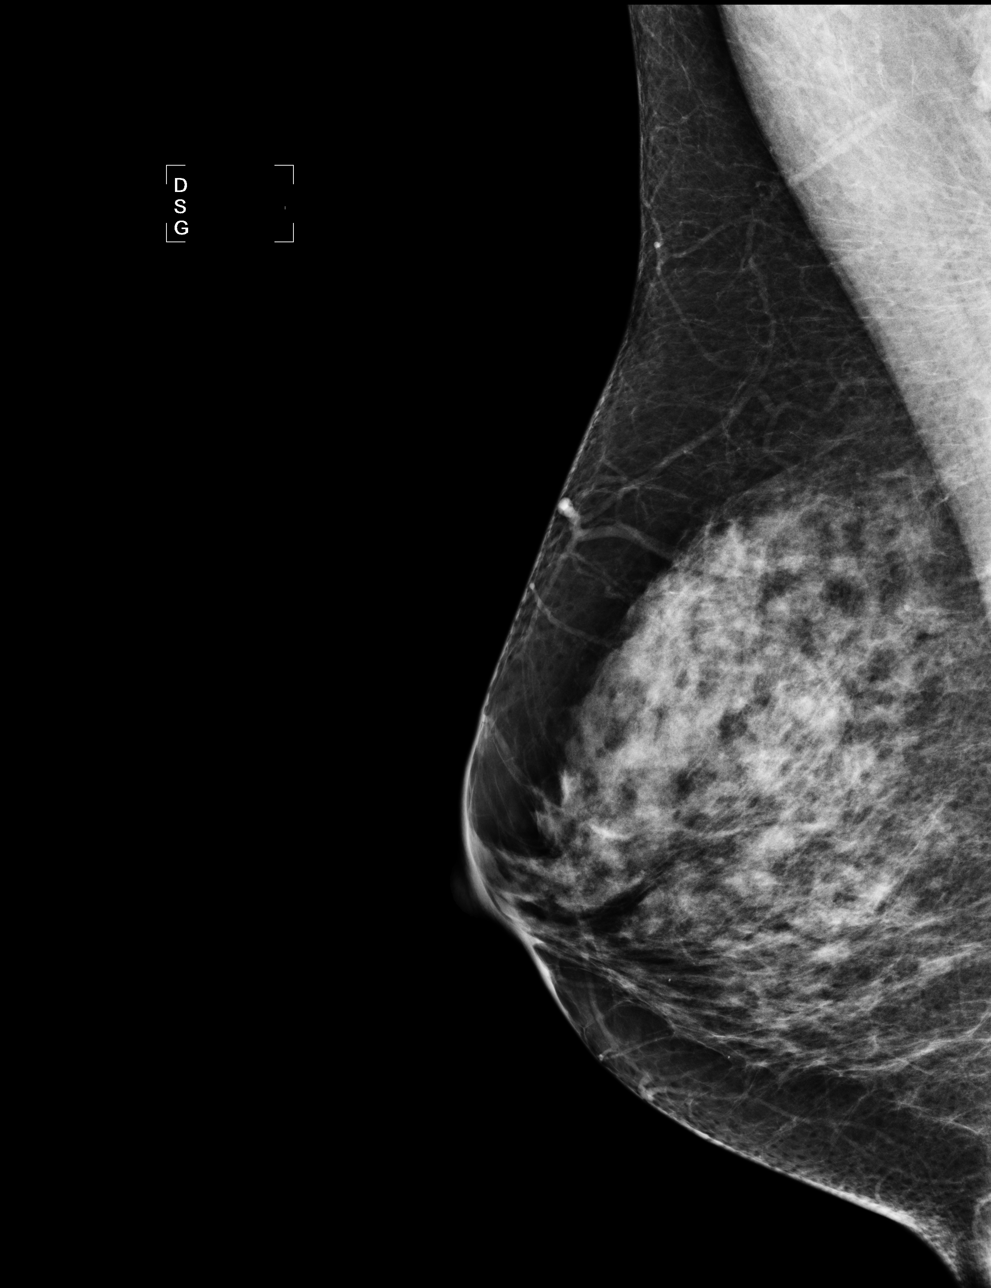

[L CC]
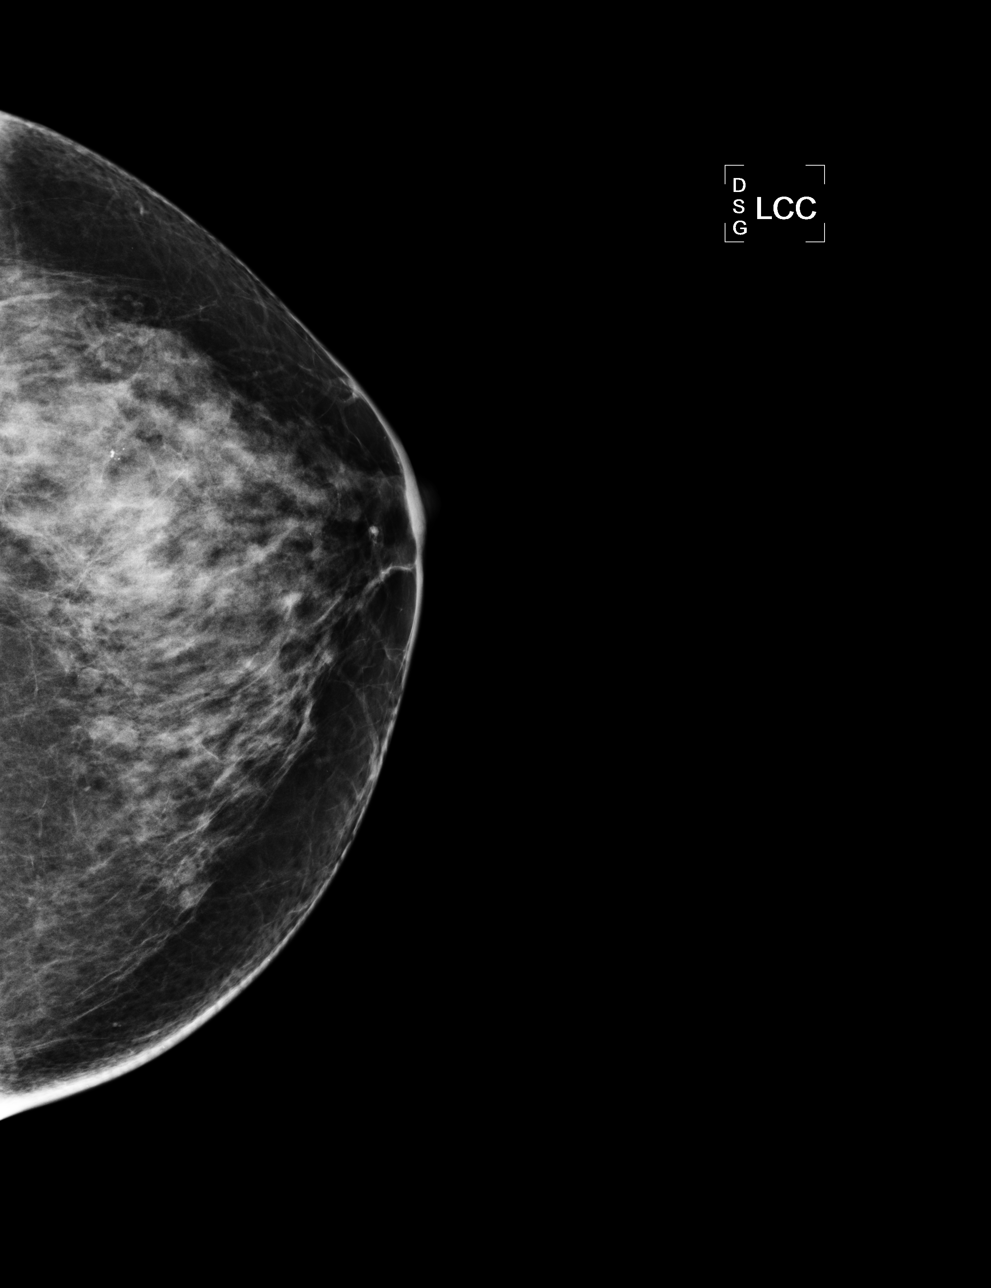

[L MLO]
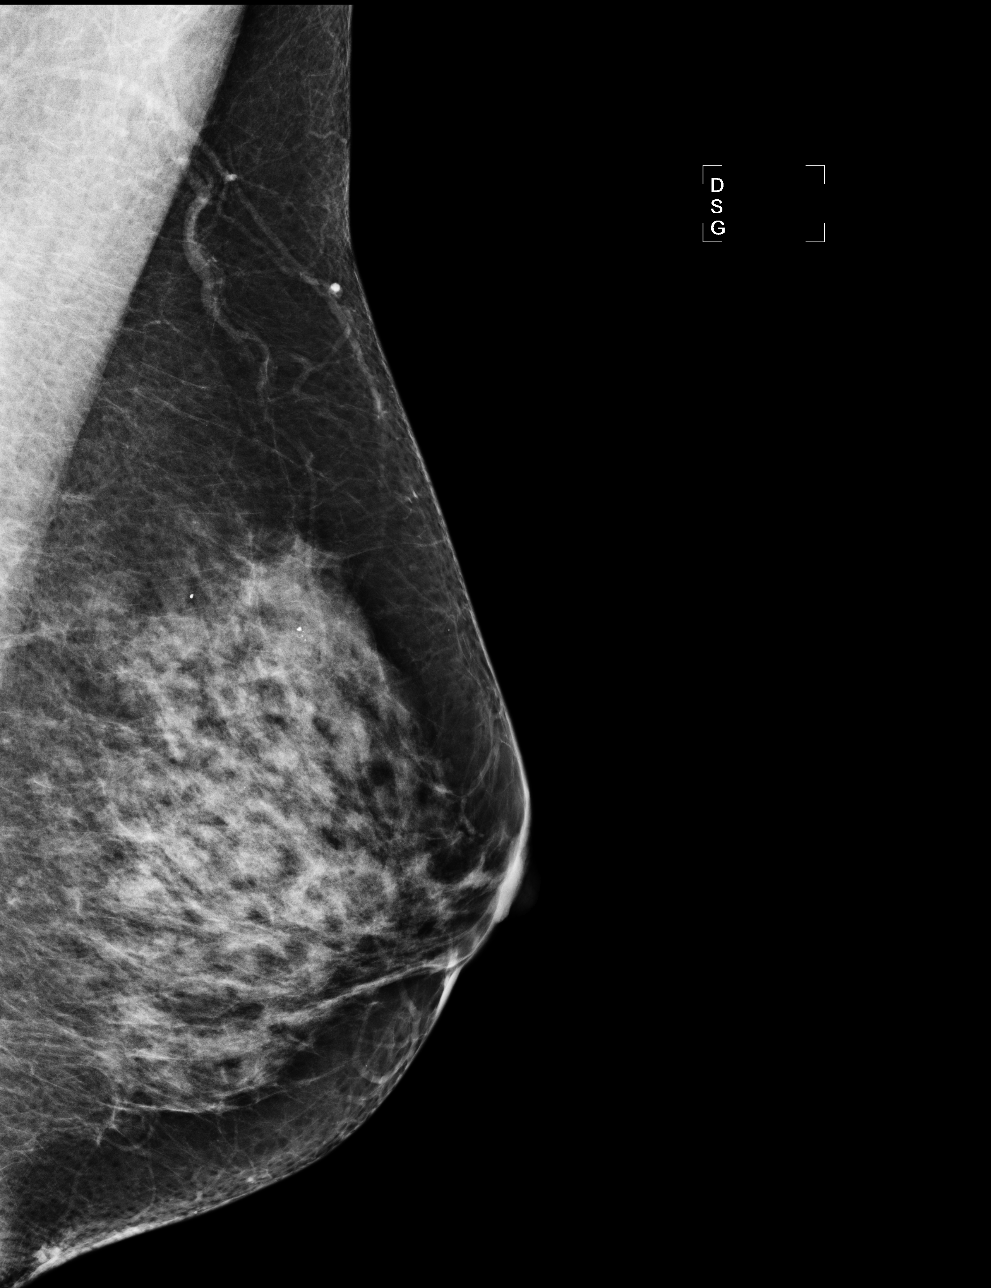

[4 of 4 positions shown; findings below may reference images not displayed]

The breast tissue is heterogeneously dense.  There is no dominant mass, architectural distortion or
calcification to suggest malignancy.

Images were processed with CAD.
IMPRESSION: No mammographic evidence of malignancy.  Suggest yearly screening mammography.

A result letter of this screening mammogram will be mailed directly to the patient.

ASSESSMENT: Negative - BI-RADS 1

Screening mammogram in 1 year.
,

## 2012-09-17 ENCOUNTER — Other Ambulatory Visit: Payer: BC Managed Care – PPO

## 2012-09-19 ENCOUNTER — Other Ambulatory Visit (INDEPENDENT_AMBULATORY_CARE_PROVIDER_SITE_OTHER): Payer: BC Managed Care – PPO

## 2012-09-19 DIAGNOSIS — Z0000 Encounter for general adult medical examination without abnormal findings: Secondary | ICD-10-CM

## 2012-09-19 LAB — POCT URINALYSIS DIPSTICK
Bilirubin, UA: NEGATIVE
Blood, UA: NEGATIVE
Glucose, UA: NEGATIVE
Ketones, UA: NEGATIVE
Nitrite, UA: NEGATIVE
Protein, UA: NEGATIVE
Spec Grav, UA: 1.015
Urobilinogen, UA: 0.2
pH, UA: 7

## 2012-09-19 LAB — LIPID PANEL
Cholesterol: 177 mg/dL (ref 0–200)
HDL: 56.8 mg/dL (ref >39.00)
LDL Cholesterol: 100 mg/dL — ABNORMAL HIGH (ref 0–99)
Total CHOL/HDL Ratio: 3
Triglycerides: 102 mg/dL (ref 0.0–149.0)
VLDL: 20.4 mg/dL (ref 0.0–40.0)

## 2012-09-19 LAB — HEPATIC FUNCTION PANEL
ALT: 22 U/L (ref 0–35)
AST: 23 U/L (ref 0–37)
Albumin: 4.1 g/dL (ref 3.5–5.2)
Alkaline Phosphatase: 67 U/L (ref 39–117)
Bilirubin, Direct: 0 mg/dL (ref 0.0–0.3)
Total Bilirubin: 0.6 mg/dL (ref 0.3–1.2)
Total Protein: 7.3 g/dL (ref 6.0–8.3)

## 2012-09-19 LAB — BASIC METABOLIC PANEL
BUN: 15 mg/dL (ref 6–23)
GFR: 97.47 mL/min (ref >60.00)
Potassium: 4.1 mEq/L (ref 3.5–5.1)

## 2012-09-19 LAB — CBC WITH DIFFERENTIAL/PLATELET
Basophils Relative: 0.5 % (ref 0.0–3.0)
Eosinophils Absolute: 0.5 10*3/uL (ref 0.0–0.7)
MCHC: 33.9 g/dL (ref 30.0–36.0)
MCV: 93.3 fl (ref 78.0–100.0)
Monocytes Absolute: 0.4 10*3/uL (ref 0.1–1.0)
Neutrophils Relative %: 47.6 % (ref 43.0–77.0)
Platelets: 287 10*3/uL (ref 150.0–400.0)

## 2012-09-23 ENCOUNTER — Ambulatory Visit (INDEPENDENT_AMBULATORY_CARE_PROVIDER_SITE_OTHER): Payer: BC Managed Care – PPO | Admitting: Family Medicine

## 2012-09-23 ENCOUNTER — Encounter: Payer: Self-pay | Admitting: Family Medicine

## 2012-09-23 VITALS — BP 124/80 | Temp 98.4°F | Ht 65.0 in | Wt 162.0 lb

## 2012-09-23 DIAGNOSIS — J309 Allergic rhinitis, unspecified: Secondary | ICD-10-CM

## 2012-09-23 DIAGNOSIS — Z820 Family history of epilepsy and other diseases of the nervous system: Secondary | ICD-10-CM

## 2012-09-23 DIAGNOSIS — F988 Other specified behavioral and emotional disorders with onset usually occurring in childhood and adolescence: Secondary | ICD-10-CM

## 2012-09-23 DIAGNOSIS — E039 Hypothyroidism, unspecified: Secondary | ICD-10-CM

## 2012-09-23 DIAGNOSIS — R002 Palpitations: Secondary | ICD-10-CM

## 2012-09-23 DIAGNOSIS — F411 Generalized anxiety disorder: Secondary | ICD-10-CM

## 2012-09-23 DIAGNOSIS — I4949 Other premature depolarization: Secondary | ICD-10-CM

## 2012-09-23 DIAGNOSIS — I493 Ventricular premature depolarization: Secondary | ICD-10-CM

## 2012-09-23 DIAGNOSIS — Z0000 Encounter for general adult medical examination without abnormal findings: Secondary | ICD-10-CM

## 2012-09-23 MED ORDER — CITALOPRAM HYDROBROMIDE 40 MG PO TABS: 40.0000 mg | ORAL_TABLET | Freq: Every day | ORAL | Status: DC

## 2012-09-23 MED ORDER — HYDROCHLOROTHIAZIDE 25 MG PO TABS: 25.0000 mg | ORAL_TABLET | Freq: Every day | ORAL | Status: DC

## 2012-09-23 MED ORDER — METHYLPHENIDATE HCL 20 MG PO TABS: 20.0000 mg | ORAL_TABLET | Freq: Two times a day (BID) | ORAL | Status: DC

## 2012-09-23 MED ORDER — BUTALBITAL-ASA-CAFFEINE 50-325-40 MG PO CAPS: 1.0000 | ORAL_CAPSULE | Freq: Two times a day (BID) | ORAL | Status: DC | PRN

## 2012-09-23 MED ORDER — LORAZEPAM 1 MG PO TABS: 1.0000 mg | ORAL_TABLET | Freq: Every day | ORAL | Status: DC

## 2012-09-23 MED ORDER — NADOLOL 20 MG PO TABS: ORAL_TABLET | ORAL | Status: DC

## 2012-09-23 MED ORDER — LEVOTHYROXINE SODIUM 50 MCG PO TABS: 50.0000 ug | ORAL_TABLET | Freq: Every day | ORAL | Status: DC

## 2012-09-23 NOTE — Patient Instructions (Signed)
Increased the Celexa to 40 mg daily to see if this will help with the sleep dysfunction  Another option would be to get a consult from [facility]  Continue your other medications  You can increase the beta blocker to 10 mg in the morning and 20 mg at bedtime if you're having a lot of palpitations  Return in one year sooner if any problems

## 2012-09-23 NOTE — Progress Notes (Signed)
  Subjective:    Patient ID: [name] [name], female    DOB: 05-17-1956, 56 y.o.   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 We talked about various options including trying other medicines or even getting a consult from [facility]. We'll try to increase the Celexa first  Over the past couple months she has increasing episodes of PVCs. Sometimes it's bigeminy. No lightheadedness no chest pain etc. She does have a history of MVP. Her caffeine consumption is 0.  She gets routine eye care, dental care, BSE monthly,,,,,,,,,,,,, fibrocystic breast changes,,,,,,,,, and you mammography, colonoscopy and GI, tetanus 2011, seasonal flu shot 2013  She had her uterus removed in 2005 because of prolapse however she still goes back to GYN for a pelvic and a Pap smear. I explained to her this is no longer necessary   Review of Systems  Constitutional: Negative.   HENT: Negative.   Eyes: Negative.   Respiratory: Negative.   Cardiovascular: Negative.   Gastrointestinal: Negative.   Genitourinary: Negative.   Musculoskeletal: Negative.   Neurological: Negative.   Hematological: Negative.   Psychiatric/Behavioral: Negative.        Objective:   Physical Exam  Constitutional: She appears well-developed and well-nourished.  HENT:  Head: Normocephalic and atraumatic.  Right Ear: External ear normal.  Left Ear: External ear normal.  Nose: Nose normal.  Mouth/Throat: Oropharynx is clear and moist.  Eyes: EOM are normal. Pupils are equal, round, and reactive to light.  Neck: Normal range of motion. Neck supple. No thyromegaly present.  Cardiovascular: Normal rate, regular rhythm,  normal heart sounds and intact distal pulses.  Exam reveals no gallop and no friction rub.   No murmur heard. Pulmonary/Chest: Effort normal and breath sounds normal.  Abdominal: Soft. Bowel sounds are normal. She exhibits no distension and no mass. There is no tenderness. There is no rebound.  Genitourinary:       Bilateral breast exam shows multiple fibrocystic changes throughout both breasts  Musculoskeletal: Normal range of motion.  Lymphadenopathy:    She has no cervical adenopathy.  Neurological: She is alert. She has normal reflexes. No cranial nerve deficit. She exhibits normal muscle tone. Coordination normal.  Skin: Skin is warm and dry.  Psychiatric: She has a normal mood and affect. Her behavior is normal. Judgment and thought content normal.          Assessment & Plan:  Healthy female  Migraine headaches continue the Corgard 20 mg daily  Sleep dysfunction continue Celexa but increase the dose to 40 mg daily Ativan when necessary  Hypothyroidism continue Levoxyl 50 mcg daily  Adult ADD continue Adderall 20 mg twice a day  PVCs observe,,,,,,, increase beta blocker to 20 mg twice a day when necessary  Fibrocystic breast changes recommend BSE monthly and annual mammography  Status post hysterectomy for prolapse,,,,,,,,,,,,, pelvic exams and Pap smears no longer indicated

## 2012-11-22 ENCOUNTER — Other Ambulatory Visit: Payer: Self-pay

## 2013-01-11 ENCOUNTER — Other Ambulatory Visit: Payer: Self-pay | Admitting: Family Medicine

## 2013-02-20 ENCOUNTER — Other Ambulatory Visit: Payer: Self-pay | Admitting: *Deleted

## 2013-02-20 NOTE — Telephone Encounter (Signed)
Patient requesting a refill of Ritalin

## 2013-02-23 MED ORDER — METHYLPHENIDATE HCL 20 MG PO TABS: 20.0000 mg | ORAL_TABLET | Freq: Two times a day (BID) | ORAL | Status: DC

## 2013-02-23 NOTE — Telephone Encounter (Signed)
Rx ready for pick up and patient is aware 

## 2013-06-03 ENCOUNTER — Telehealth: Payer: Self-pay | Admitting: Family Medicine

## 2013-06-03 DIAGNOSIS — F411 Generalized anxiety disorder: Secondary | ICD-10-CM

## 2013-06-03 NOTE — Telephone Encounter (Signed)
PT is calling to request a refill of her citalopram (CELEXA) 30 MG tablet. She states that she was recently prescribed 40mg , but is doing better on 30mg . She would also like a refill of her LORazepam (ATIVAN) 1 MG tablet, and methylphenidate (RITALIN) 20 MG tablet. She would like a 3 month supply. She would like these called into [facility]. Please assist.

## 2013-06-04 MED ORDER — METHYLPHENIDATE HCL 20 MG PO TABS: 20.0000 mg | ORAL_TABLET | Freq: Two times a day (BID) | ORAL | Status: DC

## 2013-06-04 MED ORDER — LORAZEPAM 1 MG PO TABS: 1.0000 mg | ORAL_TABLET | Freq: Every day | ORAL | Status: DC

## 2013-06-04 NOTE — Telephone Encounter (Signed)
Left detailed message on machine for patient Rx for ritalin ready for pick up. Rx for ativan can be faxed.  Rx for celexa only comes in 20 mg or 40 mg.  patent to call back with mg of celexa she would like.

## 2013-06-04 NOTE — Telephone Encounter (Signed)
Ativan fax completed

## 2013-06-05 MED ORDER — CITALOPRAM HYDROBROMIDE 20 MG PO TABS: 30.0000 mg | ORAL_TABLET | Freq: Every day | ORAL | Status: DC

## 2013-06-05 NOTE — Telephone Encounter (Signed)
Left message on voicemail to call office.  

## 2013-06-05 NOTE — Telephone Encounter (Signed)
Left message Rx was sent to pharmacy. 

## 2013-06-05 NOTE — Telephone Encounter (Signed)
Pt would like celexa 20 mg and it taking 1 and half pill for total of 30 mg a day.

## 2013-08-10 ENCOUNTER — Telehealth: Payer: Self-pay | Admitting: *Deleted

## 2013-08-10 NOTE — Telephone Encounter (Signed)
Patient would like to schedule her yearly physical before the year ends.  Is it okay to schedule ?

## 2013-08-10 NOTE — Telephone Encounter (Signed)
Pt has been sch

## 2013-08-10 NOTE — Telephone Encounter (Signed)
Okay 

## 2013-08-11 ENCOUNTER — Other Ambulatory Visit: Payer: Self-pay | Admitting: Family Medicine

## 2013-08-11 DIAGNOSIS — Z1231 Encounter for screening mammogram for malignant neoplasm of breast: Secondary | ICD-10-CM

## 2013-08-13 ENCOUNTER — Other Ambulatory Visit: Payer: Self-pay

## 2013-08-19 ENCOUNTER — Other Ambulatory Visit: Payer: Self-pay | Admitting: Family Medicine

## 2013-08-21 ENCOUNTER — Ambulatory Visit (HOSPITAL_COMMUNITY)
Admission: RE | Admit: 2013-08-21 | Discharge: 2013-08-21 | Disposition: A | Discharge status: Home / Self Care | Payer: BC Managed Care – PPO | Source: Ambulatory Visit | Attending: Family Medicine | Admitting: Family Medicine

## 2013-08-21 DIAGNOSIS — Z1231 Encounter for screening mammogram for malignant neoplasm of breast: Secondary | ICD-10-CM

## 2013-09-10 ENCOUNTER — Other Ambulatory Visit: Payer: Self-pay | Admitting: Obstetrics and Gynecology

## 2013-09-20 ENCOUNTER — Other Ambulatory Visit: Payer: Self-pay | Admitting: Family Medicine

## 2013-09-24 ENCOUNTER — Other Ambulatory Visit (INDEPENDENT_AMBULATORY_CARE_PROVIDER_SITE_OTHER): Payer: BC Managed Care – PPO

## 2013-09-24 DIAGNOSIS — Z0000 Encounter for general adult medical examination without abnormal findings: Secondary | ICD-10-CM

## 2013-09-24 LAB — HEPATIC FUNCTION PANEL
Albumin: 4.4 g/dL (ref 3.5–5.2)
Alkaline Phosphatase: 71 U/L (ref 39–117)
Bilirubin, Direct: 0 mg/dL (ref 0.0–0.3)
Total Bilirubin: 0.7 mg/dL (ref 0.3–1.2)
Total Protein: 7.3 g/dL (ref 6.0–8.3)

## 2013-09-24 LAB — LIPID PANEL
HDL: 69.6 mg/dL (ref >39.00)
Total CHOL/HDL Ratio: 3
Triglycerides: 104 mg/dL (ref 0.0–149.0)
VLDL: 20.8 mg/dL (ref 0.0–40.0)

## 2013-09-24 LAB — CBC WITH DIFFERENTIAL/PLATELET
Basophils Relative: 0.3 % (ref 0.0–3.0)
Eosinophils Relative: 6.3 % — ABNORMAL HIGH (ref 0.0–5.0)
MCV: 93.2 fl (ref 78.0–100.0)
Monocytes Absolute: 0.5 10*3/uL (ref 0.1–1.0)
Monocytes Relative: 6.8 % (ref 3.0–12.0)
Neutrophils Relative %: 51.6 % (ref 43.0–77.0)
Platelets: 271 10*3/uL (ref 150.0–400.0)
RBC: 3.84 Mil/uL — ABNORMAL LOW (ref 3.87–5.11)
WBC: 8 10*3/uL (ref 4.5–10.5)

## 2013-09-24 LAB — POCT URINALYSIS DIPSTICK
Bilirubin, UA: NEGATIVE
Leukocytes, UA: NEGATIVE
Nitrite, UA: NEGATIVE
Protein, UA: NEGATIVE
Spec Grav, UA: 1.015
pH, UA: 7

## 2013-09-24 LAB — BASIC METABOLIC PANEL
BUN: 12 mg/dL (ref 6–23)
GFR: 97.14 mL/min (ref >60.00)
Potassium: 4.1 mEq/L (ref 3.5–5.1)
Sodium: 141 mEq/L (ref 135–145)

## 2013-09-24 LAB — LDL CHOLESTEROL, DIRECT: Direct LDL: 144.7 mg/dL

## 2013-09-24 LAB — TSH: TSH: 3.43 u[IU]/mL (ref 0.35–5.50)

## 2013-09-28 ENCOUNTER — Encounter: Payer: BC Managed Care – PPO | Admitting: Family Medicine

## 2013-10-05 ENCOUNTER — Ambulatory Visit (INDEPENDENT_AMBULATORY_CARE_PROVIDER_SITE_OTHER): Payer: BC Managed Care – PPO | Admitting: Family

## 2013-10-05 ENCOUNTER — Encounter: Payer: BC Managed Care – PPO | Admitting: Family Medicine

## 2013-10-05 ENCOUNTER — Encounter: Payer: Self-pay | Admitting: Family

## 2013-10-05 VITALS — BP 118/82 | HR 71 | Ht 64.5 in | Wt 175.0 lb

## 2013-10-05 DIAGNOSIS — Z0000 Encounter for general adult medical examination without abnormal findings: Secondary | ICD-10-CM

## 2013-10-05 DIAGNOSIS — E039 Hypothyroidism, unspecified: Secondary | ICD-10-CM

## 2013-10-05 DIAGNOSIS — F411 Generalized anxiety disorder: Secondary | ICD-10-CM

## 2013-10-05 DIAGNOSIS — K219 Gastro-esophageal reflux disease without esophagitis: Secondary | ICD-10-CM

## 2013-10-05 MED ORDER — LORAZEPAM 1 MG PO TABS: 1.0000 mg | ORAL_TABLET | Freq: Every day | ORAL | Status: DC

## 2013-10-05 MED ORDER — NADOLOL 20 MG PO TABS: ORAL_TABLET | ORAL | Status: DC

## 2013-10-05 MED ORDER — METHYLPHENIDATE HCL 20 MG PO TABS: 20.0000 mg | ORAL_TABLET | Freq: Two times a day (BID) | ORAL | Status: DC

## 2013-10-05 MED ORDER — LEVOTHYROXINE SODIUM 50 MCG PO TABS: ORAL_TABLET | ORAL | Status: DC

## 2013-10-05 MED ORDER — CITALOPRAM HYDROBROMIDE 20 MG PO TABS: 30.0000 mg | ORAL_TABLET | Freq: Every day | ORAL | Status: DC

## 2013-10-05 NOTE — Progress Notes (Signed)
Subjective:    Patient ID: [name] [name], female    DOB: October 08, 1956, 57 y.o.   MRN: [id number]  HPI 57 year old white female, nonsmoker, patient of Dr. Tawanna Cooler is in today for complete physical exam. Reviewed all health maintenance protocols including mammography colonoscopy bone density and reviewed appropriate screening labs. Her immunization history was reviewed as well as her current medications and allergies refills of her chronic medications were given and the plan for yearly health maintenance was discussed all orders and referrals were made as appropriate.   Review of Systems  Constitutional: Negative.   HENT: Negative.   Eyes: Negative.   Respiratory: Negative.   Cardiovascular: Negative.   Gastrointestinal: Negative.   Endocrine: Negative.   Genitourinary: Negative.   Musculoskeletal: Negative.   Skin: Negative.   Allergic/Immunologic: Negative.   Neurological: Negative.   Hematological: Negative.   Psychiatric/Behavioral: Negative.    Past Medical History  Diagnosis Date  . Allergy   . Anemia   . Headache(784.0)   . Thyroid disease   . ADD (attention deficit disorder)   . MVP (mitral valve prolapse)   . Anxiety     History   Social History  . Marital Status: Divorced    Spouse Name: N/A    Number of Children: N/A  . Years of Education: N/A   Occupational History  . Not on file.   Social History Main Topics  . Smoking status: Never Smoker   . Smokeless tobacco: Not on file  . Alcohol Use: No  . Drug Use: No  . Sexual Activity:    Other Topics Concern  . Not on file   Social History Narrative  . No narrative on file    Past Surgical History  Procedure Laterality Date  . Abdominal hysterectomy      No family history on file.  Allergies  Allergen Reactions  . Penicillins     REACTION: as child    Current Outpatient Prescriptions on File Prior to Visit  Medication Sig Dispense Refill  . butalbital-aspirin-caffeine (FIORINAL) 50-325-40 MG  per capsule Take 1 capsule by mouth 2 (two) times daily as needed.  30 capsule  3  . hydrochlorothiazide (HYDRODIURIL) 25 MG tablet Take 1 tablet (25 mg total) by mouth daily.  100 tablet  3  . methylphenidate (RITALIN) 20 MG tablet One p.o. B.i.d.       No current facility-administered medications on file prior to visit.    BP 118/82  Pulse 71  Ht 5' 4.5" (1.638 m)  Wt 175 lb (79.379 kg)  BMI 29.59 kg/m2chart    Objective:   Physical Exam  Constitutional: She is oriented to person, place, and time. She appears well-developed and well-nourished.  HENT:  Head: Normocephalic and atraumatic.  Right Ear: External ear normal.  Left Ear: External ear normal.  Nose: Nose normal.  Mouth/Throat: Oropharynx is clear and moist.  Eyes: Conjunctivae and EOM are normal. Pupils are equal, round, and reactive to light.  Neck: Normal range of motion. Neck supple. No thyromegaly present.  Cardiovascular: Normal rate, regular rhythm and normal heart sounds.   Pulmonary/Chest: Effort normal and breath sounds normal.  Abdominal: Soft. Bowel sounds are normal. She exhibits no distension. There is no tenderness. There is no rebound.  Musculoskeletal: Normal range of motion. She exhibits no edema and no tenderness.  Neurological: She is oriented to person, place, and time. She has normal reflexes.  Skin: Skin is warm and dry.  Psychiatric: She has a normal mood  and affect.          Assessment & Plan:  Assessment: 1. CPX 2. Anxiety 3. ADD  Plan: Encouraged healthy diet, exercise, monthly self breast exams. Continue current medications. Recheck in 6 months and sooner as needed.

## 2013-10-05 NOTE — Patient Instructions (Signed)

## 2013-10-12 ENCOUNTER — Encounter: Payer: Self-pay | Admitting: Family Medicine

## 2013-10-13 ENCOUNTER — Encounter: Payer: Self-pay | Admitting: Family Medicine

## 2013-12-05 ENCOUNTER — Other Ambulatory Visit: Payer: Self-pay | Admitting: Family Medicine

## 2014-01-14 ENCOUNTER — Other Ambulatory Visit: Payer: Self-pay | Admitting: Family Medicine

## 2014-01-29 ENCOUNTER — Other Ambulatory Visit: Payer: Self-pay | Admitting: Family Medicine

## 2014-04-28 ENCOUNTER — Telehealth: Payer: Self-pay | Admitting: *Deleted

## 2014-04-28 NOTE — Telephone Encounter (Signed)
Patient requesting a refill of ritalin 20 mg twice daily

## 2014-05-03 MED ORDER — METHYLPHENIDATE HCL 20 MG PO TABS: 20.0000 mg | ORAL_TABLET | Freq: Two times a day (BID) | ORAL | Status: DC

## 2014-05-03 NOTE — Telephone Encounter (Signed)
Rx ready for pick up and Left message on machine for patient   

## 2014-05-13 ENCOUNTER — Other Ambulatory Visit: Payer: Self-pay | Admitting: *Deleted

## 2014-05-13 MED ORDER — METHYLPHENIDATE HCL 20 MG PO TABS: 20.0000 mg | ORAL_TABLET | Freq: Two times a day (BID) | ORAL | Status: DC

## 2014-05-27 ENCOUNTER — Other Ambulatory Visit: Payer: Self-pay | Admitting: Family

## 2014-07-23 ENCOUNTER — Other Ambulatory Visit: Payer: Self-pay

## 2014-09-22 ENCOUNTER — Other Ambulatory Visit: Payer: Self-pay | Admitting: Family Medicine

## 2014-09-22 DIAGNOSIS — Z1231 Encounter for screening mammogram for malignant neoplasm of breast: Secondary | ICD-10-CM

## 2014-10-05 ENCOUNTER — Ambulatory Visit (HOSPITAL_COMMUNITY)
Admission: RE | Admit: 2014-10-05 | Discharge: 2014-10-05 | Disposition: A | Discharge status: Home / Self Care | Payer: BC Managed Care – PPO | Source: Ambulatory Visit | Attending: Family Medicine | Admitting: Family Medicine

## 2014-10-05 DIAGNOSIS — Z1231 Encounter for screening mammogram for malignant neoplasm of breast: Secondary | ICD-10-CM | POA: Insufficient documentation

## 2014-10-27 ENCOUNTER — Other Ambulatory Visit: Payer: Self-pay | Admitting: *Deleted

## 2014-10-27 ENCOUNTER — Other Ambulatory Visit (INDEPENDENT_AMBULATORY_CARE_PROVIDER_SITE_OTHER): Payer: BLUE CROSS/BLUE SHIELD

## 2014-10-27 DIAGNOSIS — Z0000 Encounter for general adult medical examination without abnormal findings: Secondary | ICD-10-CM

## 2014-10-27 LAB — POCT URINALYSIS DIPSTICK
Bilirubin, UA: NEGATIVE
Glucose, UA: NEGATIVE
Ketones, UA: NEGATIVE
Leukocytes, UA: NEGATIVE
Nitrite, UA: NEGATIVE
PH UA: 8.5
Protein, UA: NEGATIVE
RBC UA: NEGATIVE
SPEC GRAV UA: 1.015
UROBILINOGEN UA: 0.2

## 2014-10-27 LAB — HEPATIC FUNCTION PANEL
ALK PHOS: 67 U/L (ref 39–117)
ALT: 22 U/L (ref 0–35)
AST: 24 U/L (ref 0–37)
Albumin: 4.2 g/dL (ref 3.5–5.2)
BILIRUBIN TOTAL: 0.9 mg/dL (ref 0.2–1.2)
Bilirubin, Direct: 0.2 mg/dL (ref 0.0–0.3)
Total Protein: 7.2 g/dL (ref 6.0–8.3)

## 2014-10-27 LAB — BASIC METABOLIC PANEL
BUN: 16 mg/dL (ref 6–23)
CO2: 32 mEq/L (ref 19–32)
Calcium: 9.8 mg/dL (ref 8.4–10.5)
Chloride: 105 mEq/L (ref 96–112)
Creatinine, Ser: 0.73 mg/dL (ref 0.40–1.20)
GFR: 86.15 mL/min (ref >60.00)
Glucose, Bld: 81 mg/dL (ref 70–99)
Potassium: 4.2 mEq/L (ref 3.5–5.1)
Sodium: 142 mEq/L (ref 135–145)

## 2014-10-27 LAB — CBC WITH DIFFERENTIAL/PLATELET
BASOS ABS: 0 10*3/uL (ref 0.0–0.1)
BASOS PCT: 0.7 % (ref 0.0–3.0)
Eosinophils Absolute: 0.5 10*3/uL (ref 0.0–0.7)
Eosinophils Relative: 6.7 % — ABNORMAL HIGH (ref 0.0–5.0)
HCT: 37.6 % (ref 36.0–46.0)
Hemoglobin: 12.8 g/dL (ref 12.0–15.0)
LYMPHS PCT: 36.1 % (ref 12.0–46.0)
Lymphs Abs: 2.6 10*3/uL (ref 0.7–4.0)
MCHC: 33.9 g/dL (ref 30.0–36.0)
MCV: 93.1 fl (ref 78.0–100.0)
Monocytes Absolute: 0.5 10*3/uL (ref 0.1–1.0)
Monocytes Relative: 6.7 % (ref 3.0–12.0)
Neutro Abs: 3.6 10*3/uL (ref 1.4–7.7)
Neutrophils Relative %: 49.8 % (ref 43.0–77.0)
PLATELETS: 276 10*3/uL (ref 150.0–400.0)
RBC: 4.04 Mil/uL (ref 3.87–5.11)
RDW: 12.4 % (ref 11.5–15.5)
WBC: 7.2 10*3/uL (ref 4.0–10.5)

## 2014-10-27 LAB — LIPID PANEL
Cholesterol: 195 mg/dL (ref 0–200)
HDL: 63.3 mg/dL (ref >39.00)
LDL Cholesterol: 108 mg/dL — ABNORMAL HIGH (ref 0–99)
NONHDL: 131.7
Total CHOL/HDL Ratio: 3
Triglycerides: 119 mg/dL (ref 0.0–149.0)
VLDL: 23.8 mg/dL (ref 0.0–40.0)

## 2014-10-27 LAB — TSH: TSH: 2.14 u[IU]/mL (ref 0.35–4.50)

## 2014-10-27 MED ORDER — CITALOPRAM HYDROBROMIDE 20 MG PO TABS: 30.0000 mg | ORAL_TABLET | Freq: Every day | ORAL | Status: DC

## 2014-10-29 ENCOUNTER — Other Ambulatory Visit: Payer: BC Managed Care – PPO

## 2014-11-02 ENCOUNTER — Other Ambulatory Visit: Payer: Self-pay | Admitting: *Deleted

## 2014-11-02 MED ORDER — CITALOPRAM HYDROBROMIDE 20 MG PO TABS: 30.0000 mg | ORAL_TABLET | Freq: Every day | ORAL | Status: DC

## 2014-11-04 ENCOUNTER — Ambulatory Visit (INDEPENDENT_AMBULATORY_CARE_PROVIDER_SITE_OTHER): Payer: BLUE CROSS/BLUE SHIELD | Admitting: Family Medicine

## 2014-11-04 ENCOUNTER — Encounter: Payer: Self-pay | Admitting: Family Medicine

## 2014-11-04 VITALS — BP 122/64 | HR 78 | Temp 98.2°F | Ht 64.5 in | Wt 170.0 lb

## 2014-11-04 DIAGNOSIS — F909 Attention-deficit hyperactivity disorder, unspecified type: Secondary | ICD-10-CM

## 2014-11-04 DIAGNOSIS — G44001 Cluster headache syndrome, unspecified, intractable: Secondary | ICD-10-CM

## 2014-11-04 DIAGNOSIS — F988 Other specified behavioral and emotional disorders with onset usually occurring in childhood and adolescence: Secondary | ICD-10-CM

## 2014-11-04 DIAGNOSIS — R002 Palpitations: Secondary | ICD-10-CM

## 2014-11-04 DIAGNOSIS — F411 Generalized anxiety disorder: Secondary | ICD-10-CM

## 2014-11-04 DIAGNOSIS — Z820 Family history of epilepsy and other diseases of the nervous system: Secondary | ICD-10-CM

## 2014-11-04 DIAGNOSIS — E038 Other specified hypothyroidism: Secondary | ICD-10-CM

## 2014-11-04 MED ORDER — SCOPOLAMINE 1 MG/3DAYS TD PT72: 1.0000 | MEDICATED_PATCH | TRANSDERMAL | Status: DC

## 2014-11-04 MED ORDER — CITALOPRAM HYDROBROMIDE 20 MG PO TABS: 30.0000 mg | ORAL_TABLET | Freq: Every day | ORAL | Status: DC

## 2014-11-04 MED ORDER — HYDROCHLOROTHIAZIDE 25 MG PO TABS: 25.0000 mg | ORAL_TABLET | Freq: Every day | ORAL | Status: DC

## 2014-11-04 MED ORDER — LORAZEPAM 1 MG PO TABS: 1.0000 mg | ORAL_TABLET | Freq: Every day | ORAL | Status: DC

## 2014-11-04 MED ORDER — LEVOTHYROXINE SODIUM 50 MCG PO TABS: 50.0000 ug | ORAL_TABLET | Freq: Every day | ORAL | Status: DC

## 2014-11-04 MED ORDER — BUTALBITAL-ASA-CAFFEINE 50-325-40 MG PO CAPS: 1.0000 | ORAL_CAPSULE | Freq: Two times a day (BID) | ORAL | Status: AC | PRN

## 2014-11-04 MED ORDER — METHYLPHENIDATE HCL 20 MG PO TABS: 20.0000 mg | ORAL_TABLET | Freq: Two times a day (BID) | ORAL | Status: DC

## 2014-11-04 MED ORDER — ATENOLOL 25 MG PO TABS: 25.0000 mg | ORAL_TABLET | Freq: Every day | ORAL | Status: DC

## 2014-11-04 NOTE — Progress Notes (Signed)
Subjective:    Patient ID: [name] [name], female    DOB: 12/01/1955, 58 y.o.   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 It was twice a day she is down now to 1 a day and sometimes even takes a half. She's decrease the dose could she is having more palpitations. They seem to be PVC type palpitations because she gets sometimes lightheaded. She does not drink a lot of caffeine.  She stopped her Corgard 20 mg because it causes bradycardia. Heart rate dropped below 60  She had her uterus and ovaries removed years ago for nonmalignant reasons over still sees her gynecologist yearly??  She gets routine eye care, dental care, BSE monthly, and you mammography, colonoscopy 2007 normal.  Vaccinations up-to-date   Review of Systems  Constitutional: Negative.   HENT: Negative.   Eyes: Negative.   Respiratory: Negative.   Cardiovascular: Negative.   Gastrointestinal: Negative.   Endocrine: Negative.   Genitourinary: Negative.   Musculoskeletal: Negative.   Skin: Negative.   Allergic/Immunologic: Negative.   Neurological: Negative.   Hematological: Negative.   Psychiatric/Behavioral: Negative.        Objective:   Physical Exam  Constitutional: She appears well-developed and well-nourished.  HENT:  Head: Normocephalic and atraumatic.  Right Ear: External ear normal.  Left Ear: External ear normal.  Nose: Nose normal.  Mouth/Throat: Oropharynx is clear and moist.  Eyes: EOM are normal. Pupils are equal, round, and reactive to light.    Neck: Normal range of motion. Neck supple. No JVD present. No tracheal deviation present. No thyromegaly present.  Cardiovascular: Normal rate, regular rhythm, normal heart sounds and intact distal pulses.  Exam reveals no gallop and no friction rub.   No murmur heard. Pulmonary/Chest: Effort normal and breath sounds normal. No stridor. No respiratory distress. She has no wheezes. She has no rales. She exhibits no tenderness.  Abdominal: Soft. Bowel sounds are normal. She exhibits no distension and no mass. There is no tenderness. There is no rebound and no guarding.  Genitourinary:  Bilateral breast exam shows multiple fibrocystic changes throughout both breasts.  Musculoskeletal: Normal range of motion.  Lymphadenopathy:    She has no cervical adenopathy.  Neurological: She is alert. She has normal reflexes. No cranial nerve deficit. She exhibits normal muscle tone. Coordination normal.  Skin: Skin is warm and dry. No rash noted. No erythema. No pallor.  Total body skin exam normal  Psychiatric: She has a normal mood and affect. Her behavior is normal. Judgment and thought content normal.  Nursing note and vitals reviewed.         Assessment & Plan:  Healthy female  Migraine headaches....... continue Fiorinal  History of mild depression..... Continue Celexa  Fluid retention ........ Hydrocort thiazide 25 mg daily when necessary  Anxiety ...........Marland Kitchen Ativan 1 mg daily at bedtime  Hypothyroidism ........... Synthroid 50 g daily  Adult ADD continue Ritalin 20 mg decrease to once daily  Palpitations ........ atenolol 25 mg one half tab daily follow-up in 4-6 weeks .......Marland Kitchen

## 2014-11-04 NOTE — Patient Instructions (Signed)
Ritalin 20 mg......... one half to one tablet daily when necessary  Stop the Corgard  Atenolol 25 mg........... one half tab daily in the morning  Return in 4-6 weeks for follow-up

## 2014-11-04 NOTE — Progress Notes (Signed)
Pre visit review using our clinic review tool, if applicable. No additional management support is needed unless otherwise documented below in the visit note. 

## 2014-11-10 ENCOUNTER — Ambulatory Visit (INDEPENDENT_AMBULATORY_CARE_PROVIDER_SITE_OTHER): Payer: BLUE CROSS/BLUE SHIELD | Admitting: *Deleted

## 2014-11-10 DIAGNOSIS — Z23 Encounter for immunization: Secondary | ICD-10-CM

## 2014-11-25 ENCOUNTER — Telehealth: Payer: Self-pay

## 2014-11-25 DIAGNOSIS — R5381 Other malaise: Secondary | ICD-10-CM

## 2014-11-25 DIAGNOSIS — R002 Palpitations: Secondary | ICD-10-CM

## 2014-11-25 NOTE — Telephone Encounter (Signed)
Referral placed.

## 2014-11-25 NOTE — Telephone Encounter (Signed)
Pt called to request a referral to see Dr. Burt Knack for a full cardiac work up.  Pt states Dr. Burt Knack is only taking pts if a referral is received.  Ok to do referral for frequent PVC's?

## 2014-12-03 ENCOUNTER — Other Ambulatory Visit: Payer: Self-pay | Admitting: Family Medicine

## 2014-12-15 ENCOUNTER — Ambulatory Visit: Payer: BLUE CROSS/BLUE SHIELD | Admitting: Cardiology

## 2014-12-15 ENCOUNTER — Ambulatory Visit: Payer: BLUE CROSS/BLUE SHIELD | Admitting: Family Medicine

## 2014-12-21 ENCOUNTER — Encounter: Payer: Self-pay | Admitting: *Deleted

## 2014-12-22 ENCOUNTER — Ambulatory Visit (INDEPENDENT_AMBULATORY_CARE_PROVIDER_SITE_OTHER): Payer: BLUE CROSS/BLUE SHIELD | Admitting: *Deleted

## 2014-12-22 DIAGNOSIS — I781 Nevus, non-neoplastic: Secondary | ICD-10-CM

## 2014-12-22 DIAGNOSIS — I8393 Asymptomatic varicose veins of bilateral lower extremities: Secondary | ICD-10-CM

## 2014-12-22 NOTE — Progress Notes (Signed)
X=.3% Sotradecol administered with a 27g butterfly.  Patient received a total of 12cc.  Treated major areas of concern. (not small red vessels). Easy access. Tol well. Anticipate good results. Follow prn.  Photos: Yes.    Compression stockings applied: Yes.

## 2014-12-23 ENCOUNTER — Encounter: Payer: Self-pay | Admitting: *Deleted

## 2015-01-01 ENCOUNTER — Other Ambulatory Visit: Payer: Self-pay | Admitting: Family Medicine

## 2015-01-27 ENCOUNTER — Other Ambulatory Visit: Payer: Self-pay | Admitting: *Deleted

## 2015-01-27 MED ORDER — CITALOPRAM HYDROBROMIDE 20 MG PO TABS: 30.0000 mg | ORAL_TABLET | Freq: Every day | ORAL | Status: DC

## 2015-01-27 MED ORDER — HYDROCHLOROTHIAZIDE 25 MG PO TABS: 25.0000 mg | ORAL_TABLET | Freq: Every day | ORAL | Status: DC

## 2015-02-03 ENCOUNTER — Ambulatory Visit (INDEPENDENT_AMBULATORY_CARE_PROVIDER_SITE_OTHER): Payer: BLUE CROSS/BLUE SHIELD | Admitting: Cardiology

## 2015-02-03 ENCOUNTER — Encounter: Payer: Self-pay | Admitting: Cardiology

## 2015-02-03 VITALS — BP 122/68 | HR 62 | Ht 65.0 in | Wt 169.2 lb

## 2015-02-03 DIAGNOSIS — Z8679 Personal history of other diseases of the circulatory system: Secondary | ICD-10-CM | POA: Diagnosis not present

## 2015-02-03 DIAGNOSIS — R002 Palpitations: Secondary | ICD-10-CM

## 2015-02-03 DIAGNOSIS — I493 Ventricular premature depolarization: Secondary | ICD-10-CM | POA: Diagnosis not present

## 2015-02-03 NOTE — Progress Notes (Addendum)
PATIENT: [name] [name] MRN: [id number] DOB: 1956/05/31 PCP: Joycelyn Man, MD  Clinic Note: Chief Complaint  Patient presents with  . Establish Care    palpitation,no other concerns    HPI: [name] [name] is a 59 y.o. female with a PMH below who presents today for for evaluation and management of frequent palpitations/PVCs. She is self does not have much of any significant medical history. She was given a diagnosis of mitral valve prolapse in the past. She was also told that she had a history of a murmur in the past. I do not see that she has had an echocardiogram in the past.  Interval History: she presents today for evaluation of these frequent PVCs. She saw her PCP back in January and had been dealing with frequent bouts of multiple palpitations (previously described as PVCs) and had an EKG that showed essentially that she had multiple PVCs in the course of her EKG strip. She has not had enough of them in a row to lead  Her to believe that she is having an arrhythmia, but he she has noted that with a temporary frequent she gets a little bit of dizzy and near syncopal. She denies any chest tightness or pressure associated with it however. She has cut back dramatically on her caffeine dose because she knows the caffeine and stress is better worse. She also does note that there more frequent in the fall, she is not sure why but it may have to do with the stresses in her life at that time. She takes Ritalin, but says that this does not affect her A. Fib. Otherwise from a cardiac standpoint she is relatively stable.  The remainder of cardiac review of systems is as follows: no chest pain or dyspnea on exertion positive for - irregular heartbeat, palpitations, rapid heart rate and Dizziness/near syncope associated only with frequent PVCs in bursts negative for - loss of consciousness, murmur, orthopnea, paroxysmal nocturnal dyspnea, shortness of breath or  syncope/near-syncope, TIA/amaurosis  fugax :   Past Medical History  Diagnosis Date  . Allergy   . Anemia   . Headache(784.0)   . Thyroid disease   . ADD (attention deficit disorder)   . MVP (mitral valve prolapse)   . Anxiety     Prior Cardiac Evaluation and Past Surgical History: Past Surgical History  Procedure Laterality Date  . Abdominal hysterectomy      Allergies  Allergen Reactions  . Penicillins     REACTION: as child    Current Outpatient Prescriptions  Medication Sig Dispense Refill  . atenolol (TENORMIN) 25 MG tablet Take 1 tablet (25 mg total) by mouth daily. (Patient taking differently: Take 12.5 mg by mouth daily. ) 90 tablet 3  . butalbital-aspirin-caffeine (FIORINAL) 50-325-40 MG per capsule Take 1 capsule by mouth 2 (two) times daily as needed. 30 capsule 3  . citalopram (CELEXA) 20 MG tablet Take 1.5 tablets (30 mg total) by mouth daily. 135 tablet 1  . hydrochlorothiazide (HYDRODIURIL) 25 MG tablet Take 1 tablet (25 mg total) by mouth daily. (Patient taking differently: Take 25 mg by mouth daily as needed. ) 90 tablet 1  . levothyroxine (SYNTHROID) 50 MCG tablet Take 1 tablet (50 mcg total) by mouth daily. 90 tablet 3  . LORazepam (ATIVAN) 1 MG tablet Take 1 tablet (1 mg total) by mouth at bedtime. 90 tablet 3  . methylphenidate (RITALIN) 20 MG tablet Take 1 tablet (20 mg total) by mouth 2 (two) times daily. [place]  tablet 0  . methylphenidate (RITALIN) 20 MG tablet Take 1 tablet (20 mg total) by mouth 2 (two) times daily. 60 tablet 0  . methylphenidate (RITALIN) 20 MG tablet Take 1 tablet (20 mg total) by mouth 2 (two) times daily with breakfast and lunch. 180 tablet 0  . methylphenidate (RITALIN) 20 MG tablet Take 1 tablet (20 mg total) by mouth 2 (two) times daily. 60 tablet 0  . scopolamine (TRANSDERM-SCOP) 1 MG/3DAYS Place 1 patch (1.5 mg total) onto the skin every 3 (three) days. 3 patch 2  . SYNTHROID 50 MCG tablet TAKE 1 TABLET DAILY 90 tablet 3   No current facility-administered  medications for this visit.    History  Substance Use Topics  . Smoking status: Never Smoker   . Smokeless tobacco: Not on file  . Alcohol Use: No    family history includes Bradycardia in her paternal grandmother; COPD in her mother; Coronary artery disease in her paternal aunt and paternal uncle; Hypertension in her father, maternal grandmother, paternal grandfather, paternal grandmother, and sister; Prostate cancer in her maternal grandfather; Stroke in her paternal grandmother.  ROS: A comprehensive Review of Systems - was performed Review of Systems  Constitutional: Positive for malaise/fatigue.  Cardiovascular: Negative for claudication and leg swelling.  Gastrointestinal: Negative for blood in stool and melena.  Genitourinary: Negative for hematuria.  Musculoskeletal: Negative for falls.  Endo/Heme/Allergies: Does not bruise/bleed easily.  All other systems reviewed and are negative.  PHYSICAL EXAM BP 122/68 mmHg  Pulse 62  Ht 5\' 5"  (1.651 m)  Wt 169 lb 3.2 oz (76.749 kg)  BMI 28.16 kg/m2 General appearance: alert, cooperative, appears stated age, no distress and Well-nourished, well-groomed. HEENT: [place]/AT, EOMI, MMM, anicteric sclera Neck: no adenopathy, no carotid bruit, no JVD, supple, symmetrical, trachea midline and thyroid not enlarged, symmetric, no tenderness/mass/nodules Lungs: clear to auscultation bilaterally, normal percussion bilaterally and Nonlabored, good air movement Heart: regular rate and rhythm, S1&S2 normal, no murmur, click, rub or gallop and normal apical impulse Abdomen: soft, non-tender; bowel sounds normal; no masses,  no organomegaly Extremities: extremities normal, atraumatic, no cyanosis or edema Pulses: 2+ and symmetric Skin: Skin color, texture, turgor normal. No rashes or lesions Neurologic: Alert and oriented X 3, normal strength and tone. Normal symmetric reflexes. Normal coordination and gait Cranial nerves: normal   Adult ECG Report   Rate: 62 ;  Rhythm: normal sinus rhythm  QRS Axis: 77 ;  PR Interval: 192 ;  QRS Duration: 90 ; QTc: 412;  Voltages: normal  Narrative Interpretation: Normal EKG  From PCP Office - 11/04/14:  NSR with frequent PVCs  Recent Labs:  Lab Results  Component Value Date   CHOL 195 10/27/2014   HDL 63.30 10/27/2014   LDLCALC 108* 10/27/2014   LDLDIRECT 144.7 09/24/2013   TRIG 119.0 10/27/2014   CHOLHDL 3 10/27/2014     Chemistry      Component Value Date/Time   NA 142 10/27/2014 1227   K 4.2 10/27/2014 1227   CL 105 10/27/2014 1227   CO2 32 10/27/2014 1227   BUN 16 10/27/2014 1227   CREATININE 0.73 10/27/2014 1227      Component Value Date/Time   CALCIUM 9.8 10/27/2014 1227   ALKPHOS 67 10/27/2014 1227   AST 24 10/27/2014 1227   ALT 22 10/27/2014 1227   BILITOT 0.9 10/27/2014 1227      ASSESSMENT / PLAN: Very pleasant woman with what sounds like benign PVCs. They also seem to be relatively well-controlled  with beta blocker.  Problem List Items Addressed This Visit    History of cardiac murmur    She was given diagnosis of mitral prolapse as listed on her history. She also history of a murmur. I don't hear a click or murmur. To clarify we ago check a 2-D echocardiogram anyway because of the PVCs.      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  She has not had any that seem to run together but are oftentimes in clusters. She has notably cut down her caffeine and trying to avoid stress -- oddly enough it does not seem to the Ritalin has made her worse.. She is taking 1/2 mg of atenolol.  Plan:2-D echocardiogram - to assess any structural abnormality as with PVCs involved.  GXT in order to see if the PVCs become more frequent or less frequent with exertion. More frequent PVCs with a more  concerning.  Since she does have symptomatic bursts of frequent PVCs, we talked about the use of additional when necessary half dose of atenolol if she has a bad spell. She may then want to take an additional dose/full 25 mg for the next dose. Otherwise she is to stay on the half tablet of atenolol because of the full dose made her feel too lethargic.      Relevant Orders   EKG 12-Lead (Completed)   Exercise Tolerance Test   Echocardiogram      Followup: 1-2 months   HARDING, Leonie Green, M.D., M.S. Interventional Cardiologist   Pager # [phone]

## 2015-02-03 NOTE — Patient Instructions (Signed)
Your physician has requested that you have an exercise tolerance test. For further information please visit [email or web address]. Please also follow instruction sheet, as given.  Your physician has requested that you have an echocardiogram. Echocardiography is a painless test that uses sound waves to create images of your heart. It provides your doctor with information about the size and shape of your heart and how well your heart's chambers and valves are working. This procedure takes approximately one hour. There are no restrictions for this procedure.   Your physician recommends that you schedule a follow-up appointment in 1-2 MONTH DR HARDING ,30 MIN APPOINTMENT TO GO OVER RESULTS.

## 2015-02-05 ENCOUNTER — Encounter: Payer: Self-pay | Admitting: Cardiology

## 2015-02-05 DIAGNOSIS — Z8679 Personal history of other diseases of the circulatory system: Secondary | ICD-10-CM | POA: Insufficient documentation

## 2015-02-05 DIAGNOSIS — I493 Ventricular premature depolarization: Secondary | ICD-10-CM | POA: Insufficient documentation

## 2015-02-05 NOTE — Assessment & Plan Note (Signed)
She was given diagnosis of mitral prolapse as listed on her history. She also history of a murmur. I don't hear a click or murmur. To clarify we ago check a 2-D echocardiogram anyway because of the PVCs.

## 2015-02-05 NOTE — Assessment & Plan Note (Addendum)
She does have frequent,symptomatic PVCs.  She has not had any that seem to run together but are oftentimes in clusters. She has notably cut down her caffeine and trying to avoid stress -- oddly enough it does not seem to the Ritalin has made her worse.. She is taking 1/2 mg of atenolol.  Plan:2-D echocardiogram - to assess any structural abnormality as with PVCs involved.  GXT in order to see if the PVCs become more frequent or less frequent with exertion. More frequent PVCs with a more concerning.  Since she does have symptomatic bursts of frequent PVCs, we talked about the use of additional when necessary half dose of atenolol if she has a bad spell. She may then want to take an additional dose/full 25 mg for the next dose. Otherwise she is to stay on the half tablet of atenolol because of the full dose made her feel too lethargic.

## 2015-02-23 ENCOUNTER — Telehealth: Payer: Self-pay | Admitting: *Deleted

## 2015-02-23 NOTE — Telephone Encounter (Signed)
Patient has been exposed to mycoplasma pneumonia from her grandchildren.  She does not have a fever, but is not feeling well.  Please advise.

## 2015-02-24 NOTE — Telephone Encounter (Signed)
Per Dr Sherren Mocha if there is no cough or fever then no treatment is needed.  If cough or fever then Doxycycline 100 twice daily for 10 days.  Patient is aware. No rx at this time needed.

## 2015-03-04 ENCOUNTER — Telehealth (HOSPITAL_COMMUNITY): Payer: Self-pay

## 2015-03-04 NOTE — Telephone Encounter (Signed)
Encounter complete. 

## 2015-03-08 ENCOUNTER — Other Ambulatory Visit: Payer: Self-pay

## 2015-03-08 MED ORDER — HYDROCHLOROTHIAZIDE 25 MG PO TABS: 25.0000 mg | ORAL_TABLET | Freq: Every day | ORAL | Status: DC | PRN

## 2015-03-09 ENCOUNTER — Ambulatory Visit (HOSPITAL_COMMUNITY)
Admission: RE | Admit: 2015-03-09 | Discharge: 2015-03-09 | Disposition: A | Discharge status: Home / Self Care | Payer: BLUE CROSS/BLUE SHIELD | Source: Ambulatory Visit | Attending: Cardiology | Admitting: Cardiology

## 2015-03-09 ENCOUNTER — Ambulatory Visit (HOSPITAL_BASED_OUTPATIENT_CLINIC_OR_DEPARTMENT_OTHER)
Admission: RE | Admit: 2015-03-09 | Discharge: 2015-03-09 | Disposition: A | Discharge status: Home / Self Care | Payer: BLUE CROSS/BLUE SHIELD | Source: Ambulatory Visit | Attending: Cardiology | Admitting: Cardiology

## 2015-03-09 DIAGNOSIS — Z8679 Personal history of other diseases of the circulatory system: Secondary | ICD-10-CM

## 2015-03-09 DIAGNOSIS — I493 Ventricular premature depolarization: Secondary | ICD-10-CM

## 2015-03-09 LAB — EXERCISE TOLERANCE TEST
CHL CUP MPHR: 159 {beats}/min
CHL RATE OF PERCEIVED EXERTION: 20412
CSEPED: 11 min
Estimated workload: 13.4 METS
Peak HR: 162 {beats}/min
Percent HR: 101 %
Rest HR: 78 {beats}/min

## 2015-03-10 NOTE — Progress Notes (Signed)
Quick Note:  Echo results: Good news: Essentially normal echocardiogram and normal pump function and normal valve function. No regional wall motion abnormalities = No signs to suggest heart attack.. EF: 55-60%.  The one thing he noted was very mild late systolic mitral valve prolapse. This could be related to PVCs.  Leonie Man, MD     ______

## 2015-03-11 ENCOUNTER — Telehealth: Payer: Self-pay | Admitting: *Deleted

## 2015-03-11 DIAGNOSIS — R9431 Abnormal electrocardiogram [ECG] [EKG]: Secondary | ICD-10-CM

## 2015-03-11 DIAGNOSIS — Z8679 Personal history of other diseases of the circulatory system: Secondary | ICD-10-CM

## 2015-03-11 DIAGNOSIS — I493 Ventricular premature depolarization: Secondary | ICD-10-CM

## 2015-03-11 NOTE — Telephone Encounter (Signed)
-----   Message from Leonie Man, MD sent at 03/10/2015  6:52 PM EDT ----- Unfortunately there appeared to be mild EKG changes that could be concerning for heart artery disease.  I think we need to try to do a treadmill stress test with nuclear imaging in order to confirm that the EKG portion does or does not correlate with evidence of decreased blood flow.  Plan: Order Exercise Myoview  HARDING, Leonie Green, MD

## 2015-03-11 NOTE — Telephone Encounter (Signed)
Left message to call back for results

## 2015-03-11 NOTE — Telephone Encounter (Signed)
-----   Message from Leonie Man, MD sent at 03/10/2015  6:50 PM EDT ----- Echo results: Good news: Essentially normal echocardiogram and normal pump function and normal valve function.  No regional wall motion abnormalities = No signs to suggest heart attack.. EF: 55-60%.  The one thing he noted was very mild late systolic mitral valve prolapse. This could be related to PVCs.  Leonie Man, MD

## 2015-03-14 NOTE — Telephone Encounter (Signed)
Spoke to patient.  GXT,ECHO,Result given . Verbalized understanding Patient reviewed [place] scheduler, call with appointment.for EXERCISE myoview. Please contact patient - cell or work phone - home phone not working

## 2015-04-01 ENCOUNTER — Telehealth (HOSPITAL_COMMUNITY): Payer: Self-pay

## 2015-04-01 NOTE — Telephone Encounter (Signed)
Encounter complete. 

## 2015-04-05 ENCOUNTER — Telehealth (HOSPITAL_COMMUNITY): Payer: Self-pay

## 2015-04-05 NOTE — Telephone Encounter (Signed)
Encounter complete. 

## 2015-04-06 ENCOUNTER — Ambulatory Visit (HOSPITAL_COMMUNITY)
Admission: RE | Admit: 2015-04-06 | Discharge: 2015-04-06 | Disposition: A | Discharge status: Home / Self Care | Payer: BLUE CROSS/BLUE SHIELD | Source: Ambulatory Visit | Attending: Cardiology | Admitting: Cardiology

## 2015-04-06 DIAGNOSIS — R9431 Abnormal electrocardiogram [ECG] [EKG]: Secondary | ICD-10-CM | POA: Diagnosis not present

## 2015-04-06 DIAGNOSIS — I493 Ventricular premature depolarization: Secondary | ICD-10-CM | POA: Insufficient documentation

## 2015-04-06 DIAGNOSIS — Z8679 Personal history of other diseases of the circulatory system: Secondary | ICD-10-CM

## 2015-04-06 DIAGNOSIS — Z8249 Family history of ischemic heart disease and other diseases of the circulatory system: Secondary | ICD-10-CM | POA: Diagnosis not present

## 2015-04-06 DIAGNOSIS — R079 Chest pain, unspecified: Secondary | ICD-10-CM | POA: Diagnosis not present

## 2015-04-06 DIAGNOSIS — R55 Syncope and collapse: Secondary | ICD-10-CM | POA: Diagnosis not present

## 2015-04-06 DIAGNOSIS — I341 Nonrheumatic mitral (valve) prolapse: Secondary | ICD-10-CM | POA: Diagnosis not present

## 2015-04-06 LAB — MYOCARDIAL PERFUSION IMAGING
CHL CUP NUCLEAR SDS: 3
CSEPED: 10 min
Estimated workload: 11.7 METS
LV sys vol: 31 mL
LVDIAVOL: 78 mL
MPHR: 159 {beats}/min
Peak HR: 155 {beats}/min
Percent HR: 97 %
RPE: 15
Rest HR: 65 {beats}/min
SRS: 0
SSS: 3
TID: 0.94

## 2015-04-06 MED ORDER — TECHNETIUM TC 99M SESTAMIBI GENERIC - CARDIOLITE
10.4000 | Freq: Once | INTRAVENOUS | Status: AC | PRN
  Administered 2015-04-06: 10 via INTRAVENOUS

## 2015-04-06 MED ORDER — TECHNETIUM TC 99M SESTAMIBI GENERIC - CARDIOLITE
30.3000 | Freq: Once | INTRAVENOUS | Status: AC | PRN
  Administered 2015-04-06: 30.3 via INTRAVENOUS

## 2015-04-07 ENCOUNTER — Ambulatory Visit (INDEPENDENT_AMBULATORY_CARE_PROVIDER_SITE_OTHER): Payer: BLUE CROSS/BLUE SHIELD | Admitting: Cardiology

## 2015-04-07 ENCOUNTER — Encounter: Payer: Self-pay | Admitting: Cardiology

## 2015-04-07 VITALS — BP 112/76 | HR 64 | Ht 65.0 in | Wt 165.7 lb

## 2015-04-07 DIAGNOSIS — Z8679 Personal history of other diseases of the circulatory system: Secondary | ICD-10-CM

## 2015-04-07 DIAGNOSIS — I493 Ventricular premature depolarization: Secondary | ICD-10-CM

## 2015-04-07 NOTE — Progress Notes (Signed)
PATIENT: [name] [name] MRN: [id number] DOB: 1956/07/16 PCP: Joycelyn Man, MD  Clinic Note: Chief Complaint  Patient presents with  . Follow-up    followup test-ECHO, nuclear stress, and exercise stress; no chest pain, no shortness of breath, no edema, no pain in legs, no cramping in legs, no lightheadedness, no dizziness    HPI: [name] [name] is a 59 y.o. female with a PMH below who presents today for for evaluation and management of frequent palpitations/PVCs. She is self does not have much of any significant medical history. She was given a diagnosis of mitral valve prolapse in the past. She was also told that she had a history of a murmur in the past. I do not see that she has had an echocardiogram in the past.   Echo 5/3/'97: Systolic function was normal. The estimatedvejection fraction was in the range of 55% to 60%. Wall motion was normal; there were no regional wall motion abnormalities. Left ventricular diastolic function parameters were normal. - Aortic valve: There was trivial regurgitation. - Mitral valve: Mild, late systolicprolapse  GXT with equivocal findings of ~ ST Depression --> ordered Myoview.  Myoview 6/29/'16: The left ventricular ejection fraction is normal (55-65%).  Nuclear stress EF: 61%.  Defect 1: There is a small defect of mild severity.  The study is normal.  This is a low risk study.  Upsloping ST segment depression ST segment depression of 1 mm was noted during stress in the inferolateral leads (II, III, aVF, V5 and V6).  Low risk nuclear stress test. Borderline abnormal EKG changes, however, excellent exercise tolerance. No chest pain. Occasional PVC's noted. No significant reversible ischemia.  Interval History: She presents today doing well without complaints.  Palpitations are pretty well controlled - not taking BB daily. Much less daily caffeine.   Otherwise from a cardiac standpoint she is relatively stable.  The remainder of  cardiac review of systems is as follows: no chest pain or dyspnea on exertion positive for - less frequent palpitations. No near syncope negative for - loss of consciousness, murmur, orthopnea, paroxysmal nocturnal dyspnea, rapid heart rate, shortness of breath or  syncope/near-syncope, TIA/amaurosis fugax :   Past Medical History  Diagnosis Date  . Allergy   . Anemia   . Headache(784.0)   . Thyroid disease   . ADD (attention deficit disorder)   . MVP (mitral valve prolapse)   . Anxiety     Prior Cardiac Evaluation and Past Surgical History: Past Surgical History  Procedure Laterality Date  . Abdominal hysterectomy      Allergies  Allergen Reactions  . Penicillins     REACTION: as child    Current Outpatient Prescriptions  Medication Sig Dispense Refill  . atenolol (TENORMIN) 25 MG tablet Take 1 tablet (25 mg total) by mouth daily. (Patient taking differently: Take 12.5 mg by mouth daily. ) 90 tablet 3  . butalbital-aspirin-caffeine (FIORINAL) 50-325-40 MG per capsule Take 1 capsule by mouth 2 (two) times daily as needed. 30 capsule 3  . citalopram (CELEXA) 20 MG tablet Take 1.5 tablets (30 mg total) by mouth daily. 135 tablet 1  . hydrochlorothiazide (HYDRODIURIL) 25 MG tablet Take 1 tablet (25 mg total) by mouth daily as needed. 90 tablet 1  . levothyroxine (SYNTHROID) 50 MCG tablet Take 1 tablet (50 mcg total) by mouth daily. 90 tablet 3  . LORazepam (ATIVAN) 1 MG tablet Take 1 tablet (1 mg total) by mouth at bedtime. 90 tablet 3  . methylphenidate (RITALIN)  20 MG tablet Take 1 tablet (20 mg total) by mouth 2 (two) times daily. 60 tablet 0  . scopolamine (TRANSDERM-SCOP) 1 MG/3DAYS Place 1 patch (1.5 mg total) onto the skin every 3 (three) days. 3 patch 2   No current facility-administered medications for this visit.    History  Substance Use Topics  . Smoking status: Never Smoker   . Smokeless tobacco: Not on file  . Alcohol Use: No    family history includes  Bradycardia in her paternal grandmother; COPD in her mother; Coronary artery disease in her paternal aunt and paternal uncle; Hypertension in her father, maternal grandmother, paternal grandfather, paternal grandmother, and sister; Prostate cancer in her maternal grandfather; Stroke in her paternal grandmother.  ROS: A comprehensive Review of Systems - was performed Review of Systems  Constitutional: Positive for malaise/fatigue.  Cardiovascular: Negative for claudication and leg swelling.  Gastrointestinal: Negative for blood in stool and melena.  Genitourinary: Negative for hematuria.  Musculoskeletal: Negative for falls.  Endo/Heme/Allergies: Does not bruise/bleed easily.  All other systems reviewed and are negative.  PHYSICAL EXAM BP 112/76 mmHg  Pulse 64  Ht 5\' 5"  (1.651 m)  Wt 75.161 kg (165 lb 11.2 oz)  BMI 27.57 kg/m2 General appearance: alert, cooperative, appears stated age, no distress and Well-nourished, well-groomed. HEENT: [place]/AT, EOMI, MMM, anicteric sclera Neck: no adenopathy, no carotid bruit, no JVD, supple, symmetrical, trachea midline and thyroid not enlarged, symmetric, no tenderness/mass/nodules Lungs: clear to auscultation bilaterally, normal percussion bilaterally and Nonlabored, good air movement Heart: regular rate and rhythm, S1&S2 normal, no murmur, click, rub or gallop and normal apical impulse Abdomen: soft, non-tender; bowel sounds normal; no masses,  no organomegaly Extremities: extremities normal, atraumatic, no cyanosis or edema Pulses: 2+ and symmetric Skin: Skin color, texture, turgor normal. No rashes or lesions Neurologic: Alert and oriented X 3, normal strength and tone. Normal symmetric reflexes. Normal coordination and gait Cranial nerves: normal   Adult ECG Report - n/a  Recent Labs:  Lab Results  Component Value Date   CHOL 195 10/27/2014   HDL 63.30 10/27/2014   LDLCALC 108* 10/27/2014   LDLDIRECT 144.7 09/24/2013   TRIG 119.0  10/27/2014   CHOLHDL 3 10/27/2014     Chemistry      Component Value Date/Time   NA 142 10/27/2014 1227   K 4.2 10/27/2014 1227   CL 105 10/27/2014 1227   CO2 32 10/27/2014 1227   BUN 16 10/27/2014 1227   CREATININE 0.73 10/27/2014 1227      Component Value Date/Time   CALCIUM 9.8 10/27/2014 1227   ALKPHOS 67 10/27/2014 1227   AST 24 10/27/2014 1227   ALT 22 10/27/2014 1227   BILITOT 0.9 10/27/2014 1227      ASSESSMENT / PLAN: Very pleasant woman with what sounds like benign PVCs. They also seem to be relatively well-controlled with low dose beta blocker.  Problem List Items Addressed This Visit    History of cardiac murmur    Mild late MVP without MR - no notable lesion to explain murmur.      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  Taking low dose BB - but not daily.  Can probably used as PRN, but if Sx recur, would go back to standing dose. Most likely benign PVCs with normal Echo & ST.         Followup: PRN   Leonie Man, M.D., M.S. Interventional Cardiologist   Pager #  [phone]       

## 2015-04-07 NOTE — Patient Instructions (Signed)
YOU  USE ATENOLOL AS NEEDED PER CONVERSATION WITH Dr Ellyn Hack.  CONTACT DJ PARKER AT [phone] IN REGARDS TO YOUR BILL.  Your physician recommends that you schedule a follow-up appointment ON AN AS NEEDED BASIS.

## 2015-04-09 NOTE — Assessment & Plan Note (Signed)
Less frequent vs less symptomatic.  Taking low dose BB - but not daily.  Can probably used as PRN, but if Sx recur, would go back to standing dose. Most likely benign PVCs with normal Echo & ST.

## 2015-04-09 NOTE — Assessment & Plan Note (Signed)
Mild late MVP without MR - no notable lesion to explain murmur.

## 2015-05-15 ENCOUNTER — Other Ambulatory Visit: Payer: Self-pay | Admitting: Family Medicine

## 2015-05-17 ENCOUNTER — Other Ambulatory Visit: Payer: Self-pay | Admitting: *Deleted

## 2015-05-17 DIAGNOSIS — F411 Generalized anxiety disorder: Secondary | ICD-10-CM

## 2015-05-17 NOTE — Telephone Encounter (Signed)
Patient is requesting a 90 day refill of ritalin and Ativan.

## 2015-05-18 MED ORDER — METHYLPHENIDATE HCL 20 MG PO TABS: 20.0000 mg | ORAL_TABLET | Freq: Two times a day (BID) | ORAL | Status: DC

## 2015-05-18 MED ORDER — LORAZEPAM 1 MG PO TABS: 1.0000 mg | ORAL_TABLET | Freq: Every day | ORAL | Status: DC

## 2015-05-18 NOTE — Telephone Encounter (Signed)
Rx ready for pick up and patient is aware 

## 2015-08-08 ENCOUNTER — Other Ambulatory Visit: Payer: Self-pay | Admitting: Family Medicine

## 2015-08-08 MED ORDER — HYDROCHLOROTHIAZIDE 25 MG PO TABS: 25.0000 mg | ORAL_TABLET | Freq: Every day | ORAL | Status: DC | PRN

## 2015-10-12 ENCOUNTER — Telehealth: Payer: Self-pay | Admitting: Family Medicine

## 2015-10-12 ENCOUNTER — Other Ambulatory Visit: Payer: Self-pay

## 2015-10-12 DIAGNOSIS — F411 Generalized anxiety disorder: Secondary | ICD-10-CM

## 2015-10-12 DIAGNOSIS — Z1231 Encounter for screening mammogram for malignant neoplasm of breast: Secondary | ICD-10-CM

## 2015-10-12 NOTE — Telephone Encounter (Signed)
Pt called requesting refill on methylphenidate 20 MG twice a day and Lorazepam 1MG  tab daily. Pt has to get 74mo supply or insurance won't cover it. [facility] [address]. Please advise.

## 2015-10-13 MED ORDER — LORAZEPAM 1 MG PO TABS: 1.0000 mg | ORAL_TABLET | Freq: Every day | ORAL | Status: DC

## 2015-10-13 MED ORDER — METHYLPHENIDATE HCL 20 MG PO TABS: 20.0000 mg | ORAL_TABLET | Freq: Two times a day (BID) | ORAL | Status: DC

## 2015-10-13 NOTE — Telephone Encounter (Signed)
rx ready for pick up and patient is aware and rx called in

## 2015-10-23 ENCOUNTER — Other Ambulatory Visit: Payer: Self-pay | Admitting: Family Medicine

## 2015-11-03 ENCOUNTER — Ambulatory Visit
Admission: RE | Admit: 2015-11-03 | Discharge: 2015-11-03 | Disposition: A | Discharge status: Home / Self Care | Payer: BLUE CROSS/BLUE SHIELD | Source: Ambulatory Visit

## 2015-11-03 DIAGNOSIS — Z1231 Encounter for screening mammogram for malignant neoplasm of breast: Secondary | ICD-10-CM

## 2015-11-07 ENCOUNTER — Other Ambulatory Visit: Payer: Self-pay | Admitting: Obstetrics and Gynecology

## 2015-11-07 DIAGNOSIS — R928 Other abnormal and inconclusive findings on diagnostic imaging of breast: Secondary | ICD-10-CM

## 2015-11-09 ENCOUNTER — Ambulatory Visit
Admission: RE | Admit: 2015-11-09 | Discharge: 2015-11-09 | Disposition: A | Discharge status: Home / Self Care | Payer: BLUE CROSS/BLUE SHIELD | Source: Ambulatory Visit | Attending: Obstetrics and Gynecology | Admitting: Obstetrics and Gynecology

## 2015-11-09 DIAGNOSIS — R928 Other abnormal and inconclusive findings on diagnostic imaging of breast: Secondary | ICD-10-CM

## 2015-11-15 ENCOUNTER — Other Ambulatory Visit: Payer: Self-pay | Admitting: Family Medicine

## 2015-11-16 ENCOUNTER — Other Ambulatory Visit: Payer: Self-pay | Admitting: *Deleted

## 2015-11-16 MED ORDER — ATENOLOL 25 MG PO TABS: 25.0000 mg | ORAL_TABLET | Freq: Every day | ORAL | Status: DC

## 2015-11-16 MED ORDER — LEVOTHYROXINE SODIUM 50 MCG PO TABS: 50.0000 ug | ORAL_TABLET | Freq: Every day | ORAL | Status: DC

## 2016-02-01 ENCOUNTER — Other Ambulatory Visit (INDEPENDENT_AMBULATORY_CARE_PROVIDER_SITE_OTHER): Payer: BLUE CROSS/BLUE SHIELD

## 2016-02-01 DIAGNOSIS — Z0000 Encounter for general adult medical examination without abnormal findings: Secondary | ICD-10-CM

## 2016-02-01 LAB — BASIC METABOLIC PANEL
BUN: 14 mg/dL (ref 6–23)
CO2: 32 mEq/L (ref 19–32)
Calcium: 9.2 mg/dL (ref 8.4–10.5)
Chloride: 102 mEq/L (ref 96–112)
Creatinine, Ser: 0.73 mg/dL (ref 0.40–1.20)
GFR: 85.8 mL/min (ref >60.00)
GLUCOSE: 88 mg/dL (ref 70–99)
POTASSIUM: 3.6 meq/L (ref 3.5–5.1)
Sodium: 140 mEq/L (ref 135–145)

## 2016-02-01 LAB — HEPATIC FUNCTION PANEL
ALT: 22 U/L (ref 0–35)
AST: 21 U/L (ref 0–37)
Albumin: 4.2 g/dL (ref 3.5–5.2)
Alkaline Phosphatase: 58 U/L (ref 39–117)
Bilirubin, Direct: 0.2 mg/dL (ref 0.0–0.3)
Total Bilirubin: 1.2 mg/dL (ref 0.2–1.2)
Total Protein: 7.2 g/dL (ref 6.0–8.3)

## 2016-02-01 LAB — TSH: TSH: 3.77 u[IU]/mL (ref 0.35–4.50)

## 2016-02-01 LAB — POC URINALSYSI DIPSTICK (AUTOMATED)
Bilirubin, UA: NEGATIVE
Blood, UA: NEGATIVE
Glucose, UA: NEGATIVE
Ketones, UA: NEGATIVE
Nitrite, UA: NEGATIVE
PROTEIN UA: NEGATIVE
Spec Grav, UA: 1.015
UROBILINOGEN UA: 0.2
pH, UA: 8

## 2016-02-01 LAB — CBC WITH DIFFERENTIAL/PLATELET
Basophils Absolute: 0 10*3/uL (ref 0.0–0.1)
Basophils Relative: 0.6 % (ref 0.0–3.0)
EOS PCT: 4.7 % (ref 0.0–5.0)
Eosinophils Absolute: 0.3 10*3/uL (ref 0.0–0.7)
HCT: 35 % — ABNORMAL LOW (ref 36.0–46.0)
Hemoglobin: 12 g/dL (ref 12.0–15.0)
LYMPHS ABS: 2 10*3/uL (ref 0.7–4.0)
Lymphocytes Relative: 32.2 % (ref 12.0–46.0)
MCHC: 34.4 g/dL (ref 30.0–36.0)
MCV: 92.3 fl (ref 78.0–100.0)
Monocytes Absolute: 0.4 10*3/uL (ref 0.1–1.0)
Monocytes Relative: 6.6 % (ref 3.0–12.0)
Neutro Abs: 3.4 10*3/uL (ref 1.4–7.7)
Neutrophils Relative %: 55.9 % (ref 43.0–77.0)
Platelets: 266 10*3/uL (ref 150.0–400.0)
RBC: 3.79 Mil/uL — ABNORMAL LOW (ref 3.87–5.11)
RDW: 13.1 % (ref 11.5–15.5)
WBC: 6.1 10*3/uL (ref 4.0–10.5)

## 2016-02-01 LAB — LIPID PANEL
CHOLESTEROL: 219 mg/dL — AB (ref 0–200)
HDL: 67.6 mg/dL (ref >39.00)
LDL Cholesterol: 128 mg/dL — ABNORMAL HIGH (ref 0–99)
NonHDL: 151.1
Total CHOL/HDL Ratio: 3
Triglycerides: 117 mg/dL (ref 0.0–149.0)
VLDL: 23.4 mg/dL (ref 0.0–40.0)

## 2016-02-07 ENCOUNTER — Other Ambulatory Visit: Payer: BLUE CROSS/BLUE SHIELD

## 2016-02-14 ENCOUNTER — Ambulatory Visit (INDEPENDENT_AMBULATORY_CARE_PROVIDER_SITE_OTHER): Payer: BLUE CROSS/BLUE SHIELD | Admitting: Family Medicine

## 2016-02-14 ENCOUNTER — Encounter: Payer: Self-pay | Admitting: Family Medicine

## 2016-02-14 VITALS — BP 120/80 | Temp 98.5°F | Ht 65.0 in | Wt 167.0 lb

## 2016-02-14 DIAGNOSIS — I493 Ventricular premature depolarization: Secondary | ICD-10-CM | POA: Diagnosis not present

## 2016-02-14 DIAGNOSIS — F411 Generalized anxiety disorder: Secondary | ICD-10-CM | POA: Diagnosis not present

## 2016-02-14 DIAGNOSIS — E038 Other specified hypothyroidism: Secondary | ICD-10-CM

## 2016-02-14 DIAGNOSIS — F909 Attention-deficit hyperactivity disorder, unspecified type: Secondary | ICD-10-CM

## 2016-02-14 DIAGNOSIS — I10 Essential (primary) hypertension: Secondary | ICD-10-CM | POA: Diagnosis not present

## 2016-02-14 DIAGNOSIS — Z0000 Encounter for general adult medical examination without abnormal findings: Secondary | ICD-10-CM

## 2016-02-14 DIAGNOSIS — F988 Other specified behavioral and emotional disorders with onset usually occurring in childhood and adolescence: Secondary | ICD-10-CM

## 2016-02-14 MED ORDER — METHYLPHENIDATE HCL 20 MG PO TABS: 20.0000 mg | ORAL_TABLET | Freq: Two times a day (BID) | ORAL | Status: DC

## 2016-02-14 MED ORDER — CITALOPRAM HYDROBROMIDE 20 MG PO TABS: 30.0000 mg | ORAL_TABLET | Freq: Every day | ORAL | Status: DC

## 2016-02-14 MED ORDER — LORAZEPAM 1 MG PO TABS: 1.0000 mg | ORAL_TABLET | Freq: Every day | ORAL | Status: DC

## 2016-02-14 MED ORDER — LEVOTHYROXINE SODIUM 50 MCG PO TABS: 50.0000 ug | ORAL_TABLET | Freq: Every day | ORAL | Status: DC

## 2016-02-14 MED ORDER — HYDROCHLOROTHIAZIDE 25 MG PO TABS: 25.0000 mg | ORAL_TABLET | Freq: Every day | ORAL | Status: DC | PRN

## 2016-02-14 MED ORDER — ATENOLOL 25 MG PO TABS: ORAL_TABLET | ORAL | Status: DC

## 2016-02-14 NOTE — Progress Notes (Signed)
Pre visit review using our clinic review tool, if applicable. No additional management support is needed unless otherwise documented below in the visit note. 

## 2016-02-14 NOTE — Progress Notes (Signed)
Subjective:    Patient ID: [name] [name], female    DOB: 04-06-1956, 60 y.o.   MRN: [id number]  HPI Echoe is a 60 year old married female nonsmoker who comes in today for general physical examination  She has a history of palpitations she takes Toprol 0.5 mg of Tenormin when necessary  She takes Celexa 30 mg nightly at bedtime and Ativan 1 mg at bedtime because of history of mild depression and anxiety  She takes Hydrocort thiazide 25 mg when necessary which turns out to be about 5 days out of 7  She takes Synthroid 50 g daily because of a history of hypothyroidism her TSH levels normal  She takes Ritalin 20 mg twice a day because a history of adult ADD.  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 Mammography December 2016 at the [facility] center. She had a cystic lesion in her right breast she had a subsequent ultrasound which showed a cystic lesion in one to come back for 6 month follow-up to be sure.  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  Vaccinations up-to-date   Review of Systems  Constitutional: Negative.   HENT: Negative.   Eyes: Negative.   Respiratory: Negative.   Cardiovascular: Negative.   Gastrointestinal: Negative.   Endocrine: Negative.   Genitourinary: Negative.   Musculoskeletal: Negative.   Skin: Negative.   Allergic/Immunologic: Negative.   Neurological: Negative.   Hematological: Negative.   Psychiatric/Behavioral: Negative.        Objective:   Physical Exam  Constitutional: She appears well-developed and well-nourished.  HENT:  Head: Normocephalic and atraumatic.  Right Ear: External ear normal.  Left Ear: External ear normal.  Nose: Nose normal.  Mouth/Throat: Oropharynx is clear and moist.  Eyes: EOM are normal.  Pupils are equal, round, and reactive to light.  Neck: Normal range of motion. Neck supple. No JVD present. No tracheal deviation present. No thyromegaly present.  Cardiovascular: Normal rate, regular rhythm, normal heart sounds and intact distal pulses.  Exam reveals no gallop and no friction rub.   No murmur heard. No carotid nor aortic bruits peripheral pulses 2+ and symmetrical  Pulmonary/Chest: Effort normal and breath sounds normal. No stridor. No respiratory distress. She has no wheezes. She has no rales. She exhibits no tenderness.  Abdominal: Soft. Bowel sounds are normal. She exhibits no distension and no mass. There is no tenderness. There is no rebound and no guarding.  Genitourinary:  Bilateral breast exam normal. Pelvic exam not indicated she's had her uterus and ovaries removed for nonmalignant reasons  Musculoskeletal: Normal range of motion. She exhibits no edema or tenderness.  Lymphadenopathy:    She has no cervical adenopathy.  Neurological: She is alert. She has normal reflexes. No cranial nerve deficit. She exhibits normal muscle tone. Coordination normal.  Skin: Skin is warm and dry. No rash noted. No erythema. No pallor.  Total body skin exam normal  Psychiatric: She has a normal mood and affect. Her behavior is normal. Judgment and thought content normal.  Nursing note and vitals reviewed.         Assessment & Plan:  Healthy female  History of palpitations........ Tenormin 12.5 mg when necessary  History of mild depression and anxiety........ Celexa 30 mg at bedtime along  with 1 mg of Ativan  Hypothyroidism....... continue Synthroid  Adult ADD................. continue Ritalin 20 mg twice a day

## 2016-02-14 NOTE — Patient Instructions (Signed)
Continue current medications  Start a walking program......... 30 minutes daily  Return in one year for general physical exam sooner if any problems,,,,,,,,,,,,,,,, Tommi Rumps or Almyra Free are 2 new adult nurse practitioner's or Dr. Martinique  All in December to get set up for a physical examination may 2018

## 2016-02-22 ENCOUNTER — Other Ambulatory Visit: Payer: BLUE CROSS/BLUE SHIELD

## 2016-02-27 ENCOUNTER — Encounter: Payer: BLUE CROSS/BLUE SHIELD | Admitting: Family Medicine

## 2016-05-09 ENCOUNTER — Other Ambulatory Visit: Payer: Self-pay | Admitting: Family Medicine

## 2016-05-10 ENCOUNTER — Other Ambulatory Visit: Payer: Self-pay | Admitting: General Practice

## 2016-05-10 MED ORDER — ATENOLOL 25 MG PO TABS: ORAL_TABLET | ORAL | 1 refills | Status: AC

## 2016-05-10 MED ORDER — LEVOTHYROXINE SODIUM 50 MCG PO TABS: 50.0000 ug | ORAL_TABLET | Freq: Every day | ORAL | 1 refills | Status: AC

## 2016-06-01 ENCOUNTER — Other Ambulatory Visit: Payer: Self-pay | Admitting: Obstetrics and Gynecology

## 2016-06-01 DIAGNOSIS — N6310 Unspecified lump in the right breast, unspecified quadrant: Secondary | ICD-10-CM

## 2016-06-13 ENCOUNTER — Ambulatory Visit
Admission: RE | Admit: 2016-06-13 | Discharge: 2016-06-13 | Disposition: A | Discharge status: Home / Self Care | Payer: BLUE CROSS/BLUE SHIELD | Source: Ambulatory Visit | Attending: Obstetrics and Gynecology | Admitting: Obstetrics and Gynecology

## 2016-06-13 DIAGNOSIS — N6310 Unspecified lump in the right breast, unspecified quadrant: Secondary | ICD-10-CM

## 2016-06-13 DIAGNOSIS — N63 Unspecified lump in breast: Secondary | ICD-10-CM | POA: Diagnosis not present

## 2016-10-24 ENCOUNTER — Ambulatory Visit: Payer: BLUE CROSS/BLUE SHIELD | Admitting: Family Medicine

## 2016-10-31 ENCOUNTER — Ambulatory Visit (INDEPENDENT_AMBULATORY_CARE_PROVIDER_SITE_OTHER): Payer: BLUE CROSS/BLUE SHIELD | Admitting: Family Medicine

## 2016-10-31 ENCOUNTER — Encounter: Payer: Self-pay | Admitting: Family Medicine

## 2016-10-31 VITALS — BP 120/80 | HR 73 | Temp 98.1°F | Ht 65.0 in | Wt 169.9 lb

## 2016-10-31 DIAGNOSIS — H35033 Hypertensive retinopathy, bilateral: Secondary | ICD-10-CM | POA: Diagnosis not present

## 2016-10-31 DIAGNOSIS — J301 Allergic rhinitis due to pollen: Secondary | ICD-10-CM | POA: Diagnosis not present

## 2016-10-31 DIAGNOSIS — F411 Generalized anxiety disorder: Secondary | ICD-10-CM

## 2016-10-31 DIAGNOSIS — H2513 Age-related nuclear cataract, bilateral: Secondary | ICD-10-CM | POA: Diagnosis not present

## 2016-10-31 MED ORDER — METHYLPHENIDATE HCL 20 MG PO TABS: ORAL_TABLET | ORAL | 0 refills | Status: DC

## 2016-10-31 MED ORDER — LORAZEPAM 1 MG PO TABS: 1.0000 mg | ORAL_TABLET | Freq: Every day | ORAL | 3 refills | Status: AC

## 2016-10-31 NOTE — Progress Notes (Signed)
[name] [name] is a 61 year old female nonsmoker who comes in today for clearance to take a scuba diving course in [place]  She's had no history of any cardiac or pulmonary disease.  She does have allergic rhinitis for which he takes plain Zyrtec at bedtime.  Review of systems shows no contraindication to scuba diving  BP 120/80 (BP Location: Left Arm, Patient Position: Sitting, Cuff Size: Normal)   Pulse 73   Temp 98.1 F (36.7 C) (Oral)   Ht 5\' 5"  (1.651 m)   Wt 169 lb 14.4 oz (77.1 kg)   BMI 28.27 kg/m  General she is well-developed well-nourished female no acute distress  Impression allergic rhinitis........ no contraindication to scuba diving

## 2016-10-31 NOTE — Patient Instructions (Signed)
Plain Zyrtec..........Marland Kitchen 1 at bedtime  Steroid nasal spray.........Marland Kitchen 1 shot up each nostril at bedtime  10 minutes before you dive........... one shot of Afrin nasal spray up each nostril.......Marland Kitchen remember the Afrin has a 5 night limit

## 2016-10-31 NOTE — Progress Notes (Signed)
Pre visit review using our clinic review tool, if applicable. No additional management support is needed unless otherwise documented below in the visit note. 

## 2016-11-06 ENCOUNTER — Other Ambulatory Visit: Payer: Self-pay | Admitting: Family Medicine

## 2016-12-26 ENCOUNTER — Other Ambulatory Visit: Payer: Self-pay | Admitting: Family Medicine

## 2016-12-26 DIAGNOSIS — N63 Unspecified lump in unspecified breast: Secondary | ICD-10-CM

## 2017-01-02 ENCOUNTER — Ambulatory Visit
Admission: RE | Admit: 2017-01-02 | Discharge: 2017-01-02 | Disposition: A | Discharge status: Home / Self Care | Payer: BLUE CROSS/BLUE SHIELD | Source: Ambulatory Visit | Attending: Family Medicine | Admitting: Family Medicine

## 2017-01-02 DIAGNOSIS — N63 Unspecified lump in unspecified breast: Secondary | ICD-10-CM

## 2017-01-02 DIAGNOSIS — R922 Inconclusive mammogram: Secondary | ICD-10-CM | POA: Diagnosis not present

## 2017-01-02 DIAGNOSIS — N6011 Diffuse cystic mastopathy of right breast: Secondary | ICD-10-CM | POA: Diagnosis not present

## 2017-05-07 ENCOUNTER — Other Ambulatory Visit: Payer: Self-pay | Admitting: Family Medicine

## 2017-06-28 ENCOUNTER — Encounter: Payer: Self-pay | Admitting: Family Medicine

## 2017-07-10 ENCOUNTER — Encounter: Payer: Self-pay | Admitting: Endocrinology

## 2017-07-10 DIAGNOSIS — F900 Attention-deficit hyperactivity disorder, predominantly inattentive type: Secondary | ICD-10-CM | POA: Diagnosis not present

## 2017-07-10 DIAGNOSIS — E7849 Other hyperlipidemia: Secondary | ICD-10-CM | POA: Diagnosis not present

## 2017-07-10 DIAGNOSIS — M859 Disorder of bone density and structure, unspecified: Secondary | ICD-10-CM | POA: Diagnosis not present

## 2017-07-10 DIAGNOSIS — I10 Essential (primary) hypertension: Secondary | ICD-10-CM | POA: Diagnosis not present

## 2017-07-10 DIAGNOSIS — D649 Anemia, unspecified: Secondary | ICD-10-CM | POA: Diagnosis not present

## 2017-07-10 DIAGNOSIS — Z1389 Encounter for screening for other disorder: Secondary | ICD-10-CM | POA: Diagnosis not present

## 2017-07-10 DIAGNOSIS — E038 Other specified hypothyroidism: Secondary | ICD-10-CM | POA: Diagnosis not present

## 2017-07-10 DIAGNOSIS — N6019 Diffuse cystic mastopathy of unspecified breast: Secondary | ICD-10-CM | POA: Diagnosis not present

## 2017-07-10 DIAGNOSIS — F419 Anxiety disorder, unspecified: Secondary | ICD-10-CM | POA: Diagnosis not present

## 2017-09-04 ENCOUNTER — Telehealth: Payer: Self-pay | Admitting: Family Medicine

## 2017-09-04 NOTE — Telephone Encounter (Signed)
Copied from [employer] [id number]. Topic: Quick Communication - See Telephone Encounter >> Sep 04, 2017  9:54 AM Boyd Kerbs wrote: CRM for notification. See Telephone encounter for:  Patient called needing Ritalin refill.    [facility] [address]  Please call when scrip is ready. 09/04/17.

## 2017-09-04 NOTE — Telephone Encounter (Signed)
Review for refill. 

## 2017-09-05 ENCOUNTER — Other Ambulatory Visit: Payer: Self-pay

## 2017-09-05 MED ORDER — METHYLPHENIDATE HCL 20 MG PO TABS: ORAL_TABLET | ORAL | 0 refills | Status: AC

## 2017-09-05 NOTE — Telephone Encounter (Signed)
Spoke with pt voiced understanding that her Rx is ready for pick up at the office, pt stated that she will pass by the office and pick up the Rx on Monday.

## 2017-09-05 NOTE — Telephone Encounter (Signed)
Rx has been printed waiting to be signed, pt will be notified when script is ready.

## 2017-10-02 DIAGNOSIS — Z1212 Encounter for screening for malignant neoplasm of rectum: Secondary | ICD-10-CM | POA: Diagnosis not present

## 2017-10-02 DIAGNOSIS — Z1211 Encounter for screening for malignant neoplasm of colon: Secondary | ICD-10-CM | POA: Diagnosis not present

## 2017-10-09 DIAGNOSIS — H35033 Hypertensive retinopathy, bilateral: Secondary | ICD-10-CM | POA: Diagnosis not present

## 2017-12-30 ENCOUNTER — Other Ambulatory Visit: Payer: Self-pay | Admitting: Endocrinology

## 2017-12-30 DIAGNOSIS — Z1231 Encounter for screening mammogram for malignant neoplasm of breast: Secondary | ICD-10-CM

## 2018-01-22 ENCOUNTER — Other Ambulatory Visit: Payer: Self-pay | Admitting: Family Medicine

## 2018-01-23 ENCOUNTER — Ambulatory Visit
Admission: RE | Admit: 2018-01-23 | Discharge: 2018-01-23 | Disposition: A | Discharge status: Home / Self Care | Payer: BLUE CROSS/BLUE SHIELD | Source: Ambulatory Visit | Attending: Endocrinology | Admitting: Endocrinology

## 2018-01-23 DIAGNOSIS — Z1231 Encounter for screening mammogram for malignant neoplasm of breast: Secondary | ICD-10-CM

## 2018-01-27 ENCOUNTER — Other Ambulatory Visit: Payer: Self-pay | Admitting: Endocrinology

## 2018-01-27 ENCOUNTER — Other Ambulatory Visit: Payer: Self-pay | Admitting: Family Medicine

## 2018-01-27 DIAGNOSIS — R921 Mammographic calcification found on diagnostic imaging of breast: Secondary | ICD-10-CM

## 2018-01-28 DIAGNOSIS — E038 Other specified hypothyroidism: Secondary | ICD-10-CM | POA: Diagnosis not present

## 2018-01-28 DIAGNOSIS — E7849 Other hyperlipidemia: Secondary | ICD-10-CM | POA: Diagnosis not present

## 2018-01-28 DIAGNOSIS — D649 Anemia, unspecified: Secondary | ICD-10-CM | POA: Diagnosis not present

## 2018-01-28 DIAGNOSIS — M859 Disorder of bone density and structure, unspecified: Secondary | ICD-10-CM | POA: Diagnosis not present

## 2018-02-11 ENCOUNTER — Ambulatory Visit
Admission: RE | Admit: 2018-02-11 | Discharge: 2018-02-11 | Disposition: A | Discharge status: Home / Self Care | Payer: BLUE CROSS/BLUE SHIELD | Source: Ambulatory Visit | Attending: Endocrinology | Admitting: Endocrinology

## 2018-02-11 ENCOUNTER — Other Ambulatory Visit: Payer: Self-pay | Admitting: Endocrinology

## 2018-02-11 DIAGNOSIS — R921 Mammographic calcification found on diagnostic imaging of breast: Secondary | ICD-10-CM

## 2018-02-11 DIAGNOSIS — R920 Mammographic microcalcification found on diagnostic imaging of breast: Secondary | ICD-10-CM | POA: Diagnosis not present

## 2018-04-24 ENCOUNTER — Other Ambulatory Visit: Payer: Self-pay | Admitting: Family Medicine

## 2018-04-24 NOTE — Telephone Encounter (Signed)
Pt needs an office visit for more refills

## 2018-06-02 DIAGNOSIS — D485 Neoplasm of uncertain behavior of skin: Secondary | ICD-10-CM | POA: Diagnosis not present

## 2018-06-02 DIAGNOSIS — L814 Other melanin hyperpigmentation: Secondary | ICD-10-CM | POA: Diagnosis not present

## 2018-06-02 DIAGNOSIS — L820 Inflamed seborrheic keratosis: Secondary | ICD-10-CM | POA: Diagnosis not present

## 2018-06-02 DIAGNOSIS — L821 Other seborrheic keratosis: Secondary | ICD-10-CM | POA: Diagnosis not present

## 2018-06-29 ENCOUNTER — Other Ambulatory Visit: Payer: Self-pay | Admitting: Family Medicine

## 2018-06-30 ENCOUNTER — Other Ambulatory Visit: Payer: Self-pay | Admitting: Family Medicine

## 2018-07-01 NOTE — Telephone Encounter (Signed)
Pt is seen by Dr Forde Dandy, please Advise

## 2018-07-10 NOTE — Telephone Encounter (Signed)
Pt is a [facility] pt.  Will route.

## 2018-07-14 DIAGNOSIS — I10 Essential (primary) hypertension: Secondary | ICD-10-CM | POA: Diagnosis not present

## 2018-07-14 DIAGNOSIS — E7849 Other hyperlipidemia: Secondary | ICD-10-CM | POA: Diagnosis not present

## 2018-07-14 DIAGNOSIS — M859 Disorder of bone density and structure, unspecified: Secondary | ICD-10-CM | POA: Diagnosis not present

## 2018-07-14 DIAGNOSIS — E038 Other specified hypothyroidism: Secondary | ICD-10-CM | POA: Diagnosis not present

## 2018-09-08 ENCOUNTER — Other Ambulatory Visit: Payer: Self-pay | Admitting: Endocrinology

## 2018-09-08 ENCOUNTER — Ambulatory Visit
Admission: RE | Admit: 2018-09-08 | Discharge: 2018-09-08 | Disposition: A | Discharge status: Home / Self Care | Payer: BLUE CROSS/BLUE SHIELD | Source: Ambulatory Visit | Attending: Endocrinology | Admitting: Endocrinology

## 2018-09-08 DIAGNOSIS — R921 Mammographic calcification found on diagnostic imaging of breast: Secondary | ICD-10-CM

## 2018-09-10 ENCOUNTER — Ambulatory Visit
Admission: RE | Admit: 2018-09-10 | Discharge: 2018-09-10 | Disposition: A | Discharge status: Home / Self Care | Payer: BLUE CROSS/BLUE SHIELD | Source: Ambulatory Visit | Attending: Endocrinology | Admitting: Endocrinology

## 2018-09-10 ENCOUNTER — Other Ambulatory Visit: Payer: Self-pay | Admitting: Endocrinology

## 2018-09-10 DIAGNOSIS — R921 Mammographic calcification found on diagnostic imaging of breast: Secondary | ICD-10-CM

## 2018-09-10 DIAGNOSIS — N6489 Other specified disorders of breast: Secondary | ICD-10-CM | POA: Diagnosis not present

## 2018-11-03 DIAGNOSIS — H35033 Hypertensive retinopathy, bilateral: Secondary | ICD-10-CM | POA: Diagnosis not present

## 2018-12-16 ENCOUNTER — Other Ambulatory Visit: Payer: Self-pay | Admitting: Endocrinology

## 2018-12-16 DIAGNOSIS — Z1231 Encounter for screening mammogram for malignant neoplasm of breast: Secondary | ICD-10-CM

## 2019-05-01 ENCOUNTER — Other Ambulatory Visit: Payer: Self-pay

## 2019-05-01 ENCOUNTER — Ambulatory Visit
Admission: RE | Admit: 2019-05-01 | Discharge: 2019-05-01 | Disposition: A | Discharge status: Home / Self Care | Payer: Medicare Other | Source: Ambulatory Visit | Attending: Endocrinology | Admitting: Endocrinology

## 2019-05-01 DIAGNOSIS — Z1231 Encounter for screening mammogram for malignant neoplasm of breast: Secondary | ICD-10-CM | POA: Diagnosis not present

## 2019-07-20 DIAGNOSIS — Z1212 Encounter for screening for malignant neoplasm of rectum: Secondary | ICD-10-CM | POA: Diagnosis not present

## 2019-07-20 DIAGNOSIS — M859 Disorder of bone density and structure, unspecified: Secondary | ICD-10-CM | POA: Diagnosis not present

## 2019-07-20 DIAGNOSIS — I10 Essential (primary) hypertension: Secondary | ICD-10-CM | POA: Diagnosis not present

## 2019-07-20 DIAGNOSIS — E038 Other specified hypothyroidism: Secondary | ICD-10-CM | POA: Diagnosis not present

## 2019-07-20 DIAGNOSIS — E7849 Other hyperlipidemia: Secondary | ICD-10-CM | POA: Diagnosis not present

## 2019-07-20 DIAGNOSIS — Z23 Encounter for immunization: Secondary | ICD-10-CM | POA: Diagnosis not present

## 2019-07-20 DIAGNOSIS — R82998 Other abnormal findings in urine: Secondary | ICD-10-CM | POA: Diagnosis not present

## 2019-07-23 DIAGNOSIS — Z0000 Encounter for general adult medical examination without abnormal findings: Secondary | ICD-10-CM | POA: Diagnosis not present

## 2019-07-23 DIAGNOSIS — G43909 Migraine, unspecified, not intractable, without status migrainosus: Secondary | ICD-10-CM | POA: Diagnosis not present

## 2019-07-23 DIAGNOSIS — I493 Ventricular premature depolarization: Secondary | ICD-10-CM | POA: Diagnosis not present

## 2019-07-23 DIAGNOSIS — E039 Hypothyroidism, unspecified: Secondary | ICD-10-CM | POA: Diagnosis not present

## 2019-07-23 DIAGNOSIS — J309 Allergic rhinitis, unspecified: Secondary | ICD-10-CM | POA: Diagnosis not present

## 2019-07-23 DIAGNOSIS — D649 Anemia, unspecified: Secondary | ICD-10-CM | POA: Diagnosis not present

## 2019-07-23 DIAGNOSIS — Z1331 Encounter for screening for depression: Secondary | ICD-10-CM | POA: Diagnosis not present

## 2019-07-23 DIAGNOSIS — M8580 Other specified disorders of bone density and structure, unspecified site: Secondary | ICD-10-CM | POA: Diagnosis not present

## 2019-07-23 DIAGNOSIS — I10 Essential (primary) hypertension: Secondary | ICD-10-CM | POA: Diagnosis not present

## 2019-07-23 DIAGNOSIS — N6019 Diffuse cystic mastopathy of unspecified breast: Secondary | ICD-10-CM | POA: Diagnosis not present

## 2019-07-23 DIAGNOSIS — E785 Hyperlipidemia, unspecified: Secondary | ICD-10-CM | POA: Diagnosis not present

## 2019-07-23 DIAGNOSIS — Z1339 Encounter for screening examination for other mental health and behavioral disorders: Secondary | ICD-10-CM | POA: Diagnosis not present

## 2019-07-23 DIAGNOSIS — F900 Attention-deficit hyperactivity disorder, predominantly inattentive type: Secondary | ICD-10-CM | POA: Diagnosis not present

## 2019-08-25 DIAGNOSIS — Z23 Encounter for immunization: Secondary | ICD-10-CM | POA: Diagnosis not present

## 2019-11-15 ENCOUNTER — Ambulatory Visit: Payer: Medicare Other

## 2019-12-01 ENCOUNTER — Ambulatory Visit: Payer: Medicare Other

## 2020-04-06 ENCOUNTER — Other Ambulatory Visit: Payer: Self-pay | Admitting: Endocrinology

## 2020-04-06 DIAGNOSIS — Z1231 Encounter for screening mammogram for malignant neoplasm of breast: Secondary | ICD-10-CM

## 2020-05-17 ENCOUNTER — Ambulatory Visit
Admission: RE | Admit: 2020-05-17 | Discharge: 2020-05-17 | Disposition: A | Discharge status: Home / Self Care | Payer: Medicare Other | Source: Ambulatory Visit

## 2020-05-17 ENCOUNTER — Other Ambulatory Visit: Payer: Self-pay

## 2020-05-17 DIAGNOSIS — Z1231 Encounter for screening mammogram for malignant neoplasm of breast: Secondary | ICD-10-CM | POA: Diagnosis not present

## 2020-05-18 ENCOUNTER — Other Ambulatory Visit: Payer: Self-pay | Admitting: Endocrinology

## 2020-05-18 DIAGNOSIS — R928 Other abnormal and inconclusive findings on diagnostic imaging of breast: Secondary | ICD-10-CM

## 2020-06-01 ENCOUNTER — Other Ambulatory Visit: Payer: Self-pay | Admitting: Endocrinology

## 2020-06-01 ENCOUNTER — Ambulatory Visit
Admission: RE | Admit: 2020-06-01 | Discharge: 2020-06-01 | Disposition: A | Discharge status: Home / Self Care | Payer: Medicare Other | Source: Ambulatory Visit | Attending: Endocrinology | Admitting: Endocrinology

## 2020-06-01 ENCOUNTER — Other Ambulatory Visit: Payer: Self-pay

## 2020-06-01 DIAGNOSIS — R928 Other abnormal and inconclusive findings on diagnostic imaging of breast: Secondary | ICD-10-CM

## 2020-06-01 DIAGNOSIS — N6310 Unspecified lump in the right breast, unspecified quadrant: Secondary | ICD-10-CM

## 2020-06-01 DIAGNOSIS — N6313 Unspecified lump in the right breast, lower outer quadrant: Secondary | ICD-10-CM | POA: Diagnosis not present

## 2020-06-01 DIAGNOSIS — N6311 Unspecified lump in the right breast, upper outer quadrant: Secondary | ICD-10-CM | POA: Diagnosis not present

## 2020-06-01 DIAGNOSIS — R922 Inconclusive mammogram: Secondary | ICD-10-CM | POA: Diagnosis not present

## 2020-07-25 DIAGNOSIS — Z23 Encounter for immunization: Secondary | ICD-10-CM | POA: Diagnosis not present

## 2020-07-26 DIAGNOSIS — E039 Hypothyroidism, unspecified: Secondary | ICD-10-CM | POA: Diagnosis not present

## 2020-07-26 DIAGNOSIS — M859 Disorder of bone density and structure, unspecified: Secondary | ICD-10-CM | POA: Diagnosis not present

## 2020-07-26 DIAGNOSIS — E785 Hyperlipidemia, unspecified: Secondary | ICD-10-CM | POA: Diagnosis not present

## 2020-08-01 DIAGNOSIS — F419 Anxiety disorder, unspecified: Secondary | ICD-10-CM | POA: Diagnosis not present

## 2020-08-01 DIAGNOSIS — I493 Ventricular premature depolarization: Secondary | ICD-10-CM | POA: Diagnosis not present

## 2020-08-01 DIAGNOSIS — Z0000 Encounter for general adult medical examination without abnormal findings: Secondary | ICD-10-CM | POA: Diagnosis not present

## 2020-08-01 DIAGNOSIS — E785 Hyperlipidemia, unspecified: Secondary | ICD-10-CM | POA: Diagnosis not present

## 2020-08-01 DIAGNOSIS — R82998 Other abnormal findings in urine: Secondary | ICD-10-CM | POA: Diagnosis not present

## 2020-08-01 DIAGNOSIS — M859 Disorder of bone density and structure, unspecified: Secondary | ICD-10-CM | POA: Diagnosis not present

## 2020-08-01 DIAGNOSIS — G43909 Migraine, unspecified, not intractable, without status migrainosus: Secondary | ICD-10-CM | POA: Diagnosis not present

## 2020-08-01 DIAGNOSIS — Z23 Encounter for immunization: Secondary | ICD-10-CM | POA: Diagnosis not present

## 2020-08-01 DIAGNOSIS — E039 Hypothyroidism, unspecified: Secondary | ICD-10-CM | POA: Diagnosis not present

## 2020-08-01 DIAGNOSIS — N6019 Diffuse cystic mastopathy of unspecified breast: Secondary | ICD-10-CM | POA: Diagnosis not present

## 2020-08-01 DIAGNOSIS — I10 Essential (primary) hypertension: Secondary | ICD-10-CM | POA: Diagnosis not present

## 2020-08-01 DIAGNOSIS — F900 Attention-deficit hyperactivity disorder, predominantly inattentive type: Secondary | ICD-10-CM | POA: Diagnosis not present

## 2020-08-02 DIAGNOSIS — H35033 Hypertensive retinopathy, bilateral: Secondary | ICD-10-CM | POA: Diagnosis not present

## 2020-09-05 DIAGNOSIS — Z1212 Encounter for screening for malignant neoplasm of rectum: Secondary | ICD-10-CM | POA: Diagnosis not present

## 2020-12-05 ENCOUNTER — Ambulatory Visit
Admission: RE | Admit: 2020-12-05 | Discharge: 2020-12-05 | Disposition: A | Discharge status: Home / Self Care | Payer: Medicare Other | Source: Ambulatory Visit | Attending: Endocrinology | Admitting: Endocrinology

## 2020-12-05 ENCOUNTER — Other Ambulatory Visit: Payer: Self-pay | Admitting: Endocrinology

## 2020-12-05 ENCOUNTER — Other Ambulatory Visit: Payer: Self-pay

## 2020-12-05 DIAGNOSIS — N6313 Unspecified lump in the right breast, lower outer quadrant: Secondary | ICD-10-CM | POA: Diagnosis not present

## 2020-12-05 DIAGNOSIS — R928 Other abnormal and inconclusive findings on diagnostic imaging of breast: Secondary | ICD-10-CM

## 2020-12-05 DIAGNOSIS — N6311 Unspecified lump in the right breast, upper outer quadrant: Secondary | ICD-10-CM | POA: Diagnosis not present

## 2020-12-05 DIAGNOSIS — R922 Inconclusive mammogram: Secondary | ICD-10-CM | POA: Diagnosis not present

## 2021-03-07 DIAGNOSIS — Z1211 Encounter for screening for malignant neoplasm of colon: Secondary | ICD-10-CM | POA: Diagnosis not present

## 2021-03-07 DIAGNOSIS — R1319 Other dysphagia: Secondary | ICD-10-CM | POA: Diagnosis not present

## 2021-03-07 DIAGNOSIS — K591 Functional diarrhea: Secondary | ICD-10-CM | POA: Diagnosis not present

## 2021-05-02 DIAGNOSIS — D120 Benign neoplasm of cecum: Secondary | ICD-10-CM | POA: Diagnosis not present

## 2021-05-02 DIAGNOSIS — Z1211 Encounter for screening for malignant neoplasm of colon: Secondary | ICD-10-CM | POA: Diagnosis not present

## 2021-05-02 DIAGNOSIS — K648 Other hemorrhoids: Secondary | ICD-10-CM | POA: Diagnosis not present

## 2021-05-02 DIAGNOSIS — D122 Benign neoplasm of ascending colon: Secondary | ICD-10-CM | POA: Diagnosis not present

## 2021-05-05 DIAGNOSIS — D120 Benign neoplasm of cecum: Secondary | ICD-10-CM | POA: Diagnosis not present

## 2021-05-09 ENCOUNTER — Other Ambulatory Visit: Payer: Self-pay

## 2021-05-09 ENCOUNTER — Ambulatory Visit
Admission: RE | Admit: 2021-05-09 | Discharge: 2021-05-09 | Disposition: A | Discharge status: Home / Self Care | Payer: Medicare Other | Source: Ambulatory Visit | Attending: Endocrinology | Admitting: Endocrinology

## 2021-05-09 DIAGNOSIS — R928 Other abnormal and inconclusive findings on diagnostic imaging of breast: Secondary | ICD-10-CM

## 2021-05-09 DIAGNOSIS — R922 Inconclusive mammogram: Secondary | ICD-10-CM | POA: Diagnosis not present

## 2021-08-01 DIAGNOSIS — Z23 Encounter for immunization: Secondary | ICD-10-CM | POA: Diagnosis not present

## 2021-08-08 DIAGNOSIS — E039 Hypothyroidism, unspecified: Secondary | ICD-10-CM | POA: Diagnosis not present

## 2021-08-08 DIAGNOSIS — I10 Essential (primary) hypertension: Secondary | ICD-10-CM | POA: Diagnosis not present

## 2021-08-08 DIAGNOSIS — M859 Disorder of bone density and structure, unspecified: Secondary | ICD-10-CM | POA: Diagnosis not present

## 2021-08-08 DIAGNOSIS — E785 Hyperlipidemia, unspecified: Secondary | ICD-10-CM | POA: Diagnosis not present

## 2021-08-21 DIAGNOSIS — E039 Hypothyroidism, unspecified: Secondary | ICD-10-CM | POA: Diagnosis not present

## 2021-08-21 DIAGNOSIS — D126 Benign neoplasm of colon, unspecified: Secondary | ICD-10-CM | POA: Diagnosis not present

## 2021-08-21 DIAGNOSIS — R82998 Other abnormal findings in urine: Secondary | ICD-10-CM | POA: Diagnosis not present

## 2021-08-21 DIAGNOSIS — Z0000 Encounter for general adult medical examination without abnormal findings: Secondary | ICD-10-CM | POA: Diagnosis not present

## 2021-08-21 DIAGNOSIS — F419 Anxiety disorder, unspecified: Secondary | ICD-10-CM | POA: Diagnosis not present

## 2021-08-21 DIAGNOSIS — N6019 Diffuse cystic mastopathy of unspecified breast: Secondary | ICD-10-CM | POA: Diagnosis not present

## 2021-08-21 DIAGNOSIS — F900 Attention-deficit hyperactivity disorder, predominantly inattentive type: Secondary | ICD-10-CM | POA: Diagnosis not present

## 2021-08-21 DIAGNOSIS — Z23 Encounter for immunization: Secondary | ICD-10-CM | POA: Diagnosis not present

## 2021-08-21 DIAGNOSIS — E785 Hyperlipidemia, unspecified: Secondary | ICD-10-CM | POA: Diagnosis not present

## 2021-08-21 DIAGNOSIS — Z1331 Encounter for screening for depression: Secondary | ICD-10-CM | POA: Diagnosis not present

## 2021-08-21 DIAGNOSIS — I10 Essential (primary) hypertension: Secondary | ICD-10-CM | POA: Diagnosis not present

## 2021-08-21 DIAGNOSIS — I493 Ventricular premature depolarization: Secondary | ICD-10-CM | POA: Diagnosis not present

## 2021-08-21 DIAGNOSIS — Z1339 Encounter for screening examination for other mental health and behavioral disorders: Secondary | ICD-10-CM | POA: Diagnosis not present

## 2021-08-21 DIAGNOSIS — M8580 Other specified disorders of bone density and structure, unspecified site: Secondary | ICD-10-CM | POA: Diagnosis not present

## 2022-01-12 DIAGNOSIS — Z20822 Contact with and (suspected) exposure to covid-19: Secondary | ICD-10-CM | POA: Diagnosis not present

## 2022-02-05 DIAGNOSIS — Z20822 Contact with and (suspected) exposure to covid-19: Secondary | ICD-10-CM | POA: Diagnosis not present

## 2022-04-16 ENCOUNTER — Other Ambulatory Visit: Payer: Self-pay | Admitting: Endocrinology

## 2022-04-16 DIAGNOSIS — N63 Unspecified lump in unspecified breast: Secondary | ICD-10-CM

## 2022-05-11 ENCOUNTER — Ambulatory Visit
Admission: RE | Admit: 2022-05-11 | Discharge: 2022-05-11 | Disposition: A | Discharge status: Home / Self Care | Payer: Medicare Other | Source: Ambulatory Visit | Attending: Endocrinology | Admitting: Endocrinology

## 2022-05-11 DIAGNOSIS — N63 Unspecified lump in unspecified breast: Secondary | ICD-10-CM

## 2022-05-11 DIAGNOSIS — R922 Inconclusive mammogram: Secondary | ICD-10-CM | POA: Diagnosis not present

## 2022-05-11 DIAGNOSIS — N6313 Unspecified lump in the right breast, lower outer quadrant: Secondary | ICD-10-CM | POA: Diagnosis not present

## 2022-05-11 DIAGNOSIS — N6311 Unspecified lump in the right breast, upper outer quadrant: Secondary | ICD-10-CM | POA: Diagnosis not present

## 2022-07-26 IMAGING — US US BREAST*R* LIMITED INC AXILLA
1 series · 9 of 9 positions shown · non-contrast
Comparison: Previous exam(s).

CLINICAL DATA: 64-year-old female recalled from screening mammogram
dated 05/17/2020 for a possible right breast mass.

EXAM:
DIGITAL DIAGNOSTIC RIGHT MAMMOGRAM WITH CAD AND TOMO
ULTRASOUND RIGHT BREAST

[Series 1: us breast*right* limited inc axilla · 0.06mm/px · 9 of 9 slices shown]
[im 1/9]
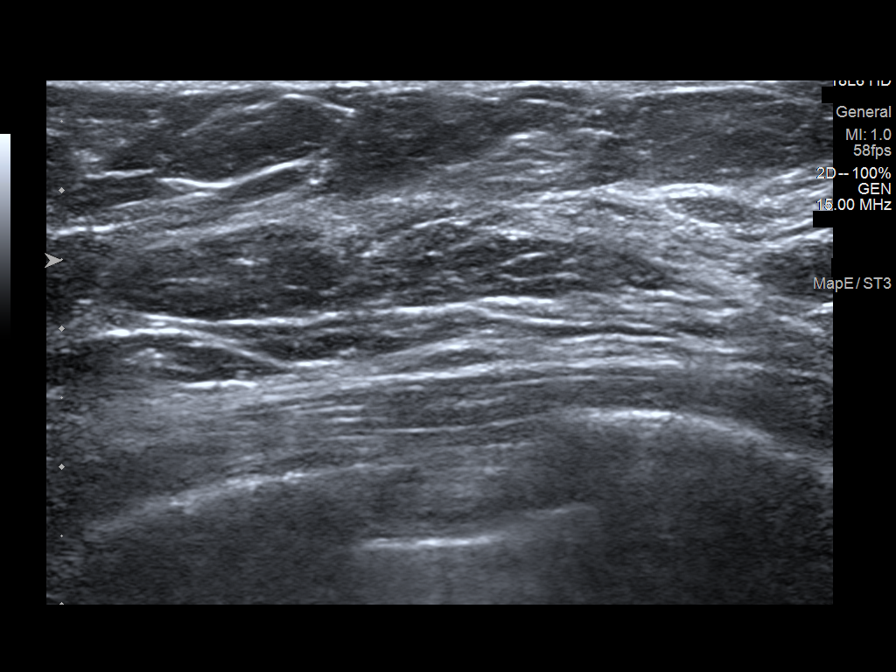
[im 2/9]
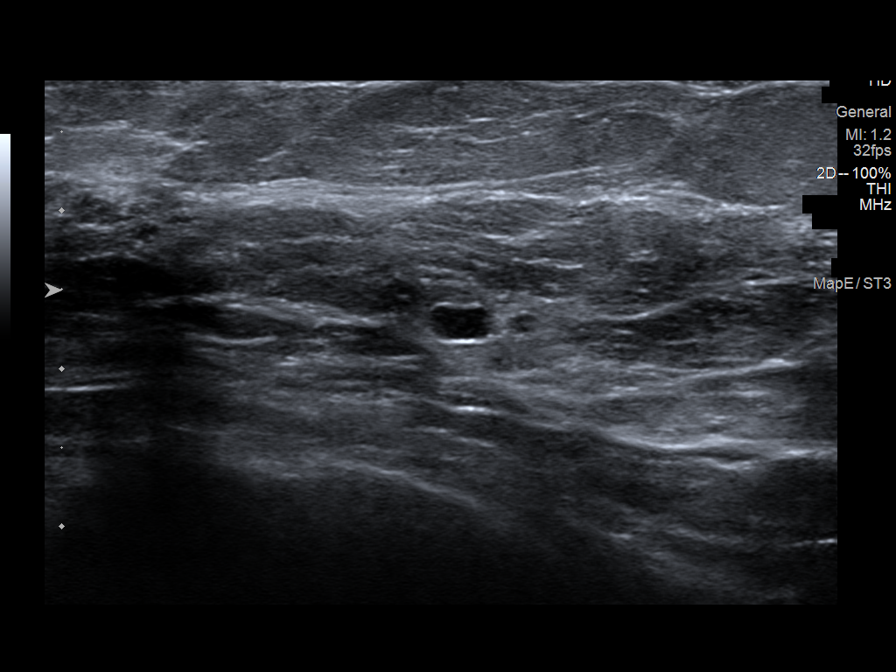
[im 3/9]
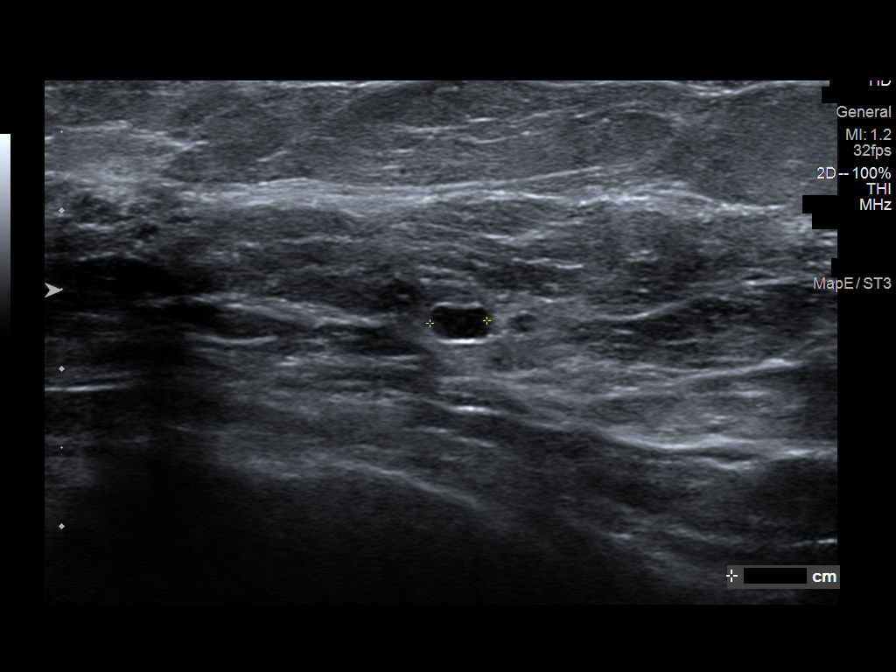
[im 4/9]
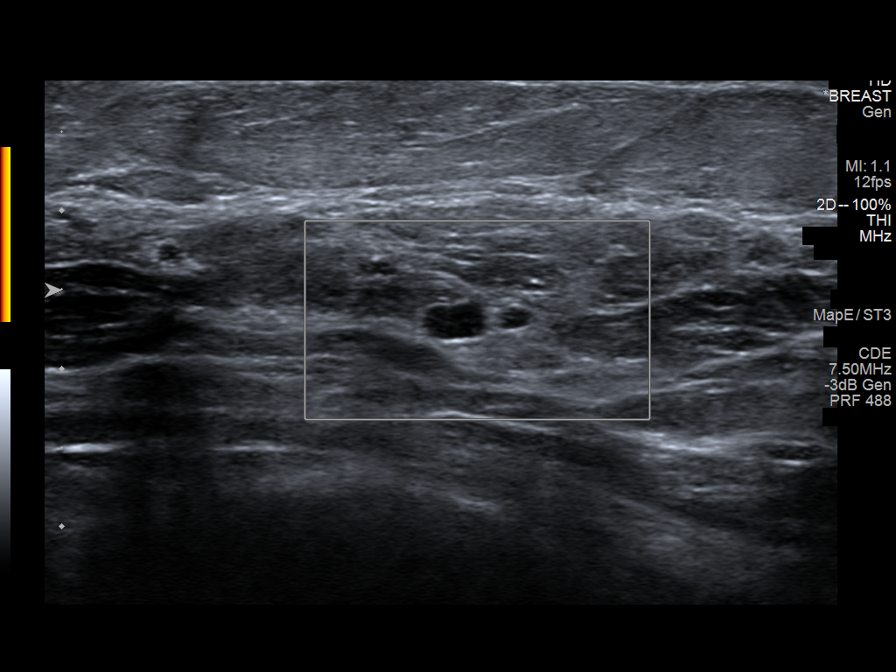
[im 5/9]
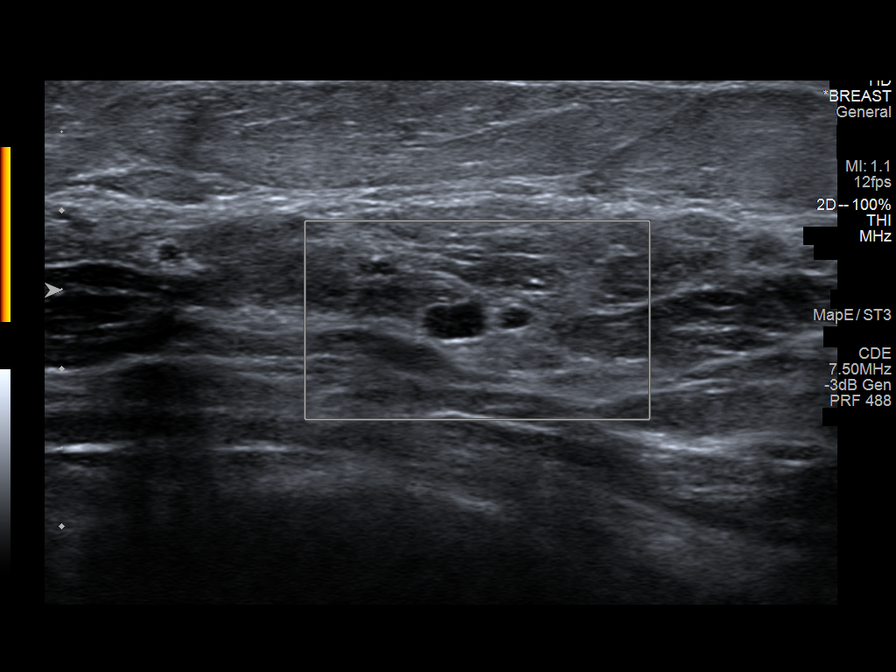
[im 6/9]
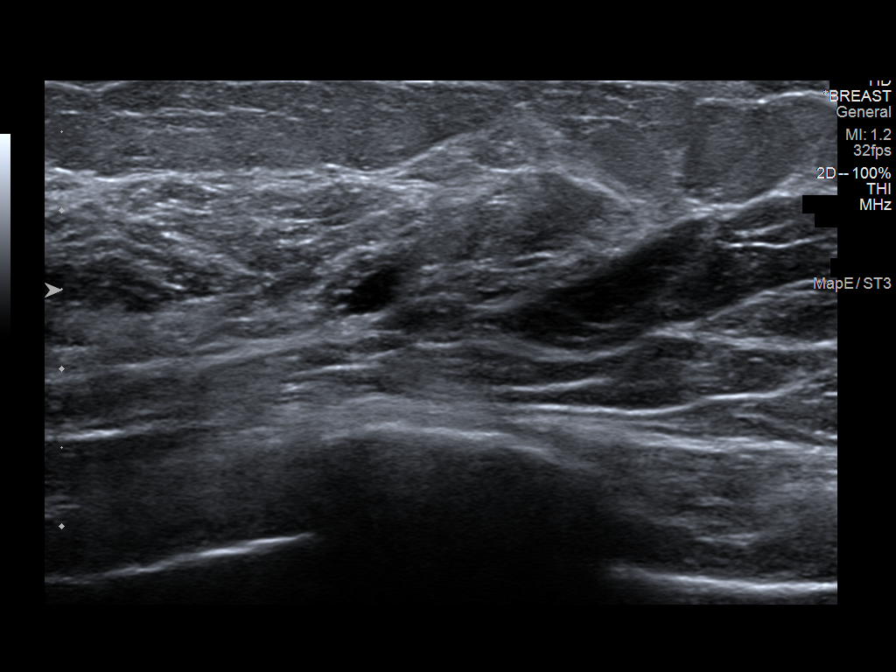
[im 7/9]
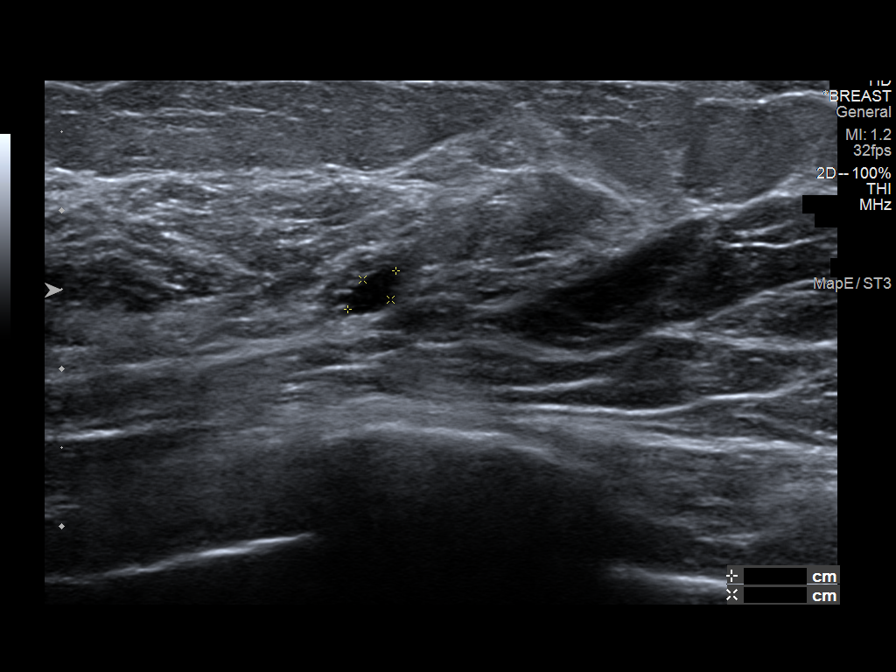
[im 8/9]
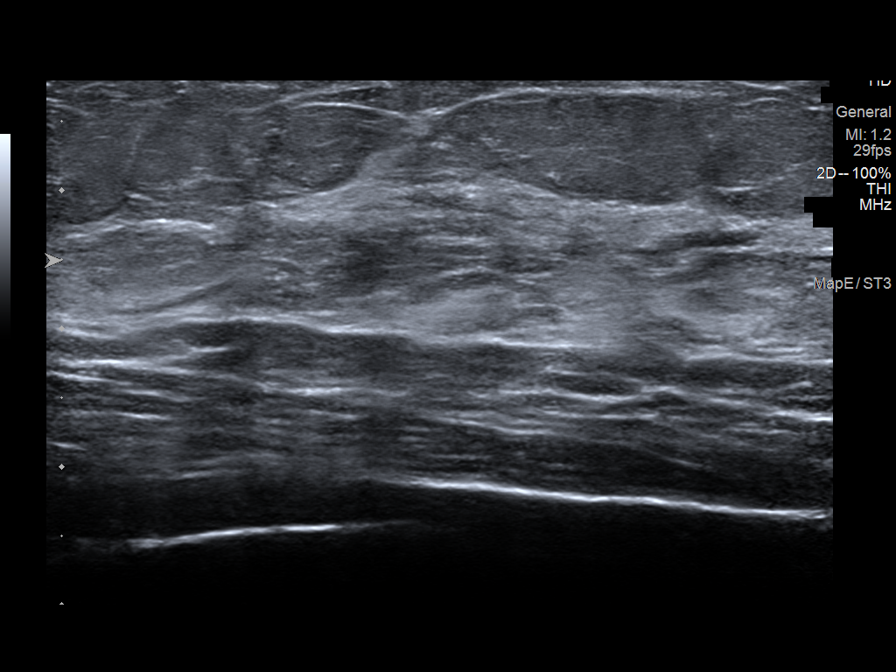
[im 9/9]
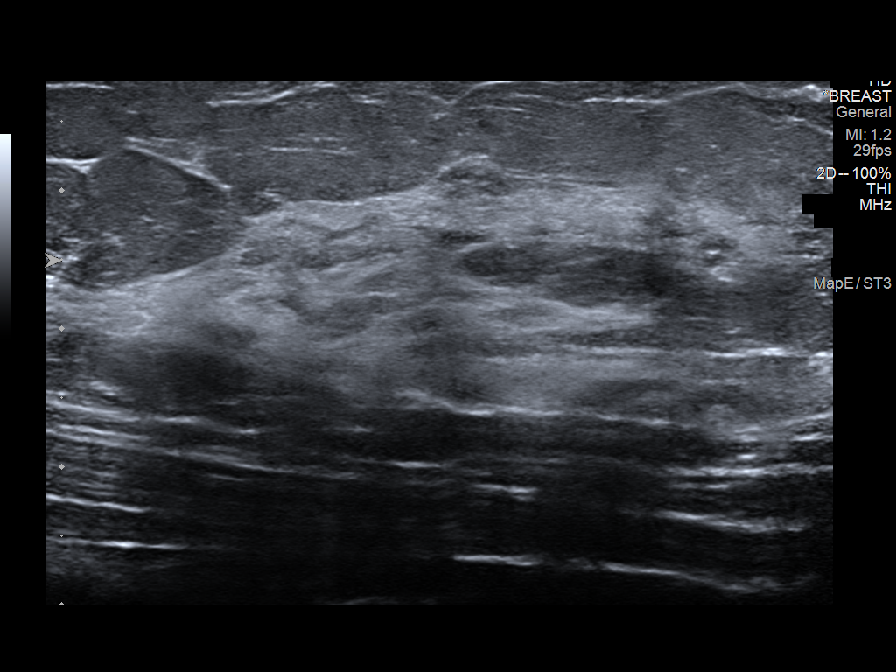

[9 of 9 positions shown; findings below may reference images not displayed]

ACR Breast Density Category c: The breast tissue is heterogeneously
dense, which may obscure small masses.
FINDINGS: A persistent oval, partially circumscribed partially obscured mass
is seen best on the cc projection in the lateral right breast at
posterior depth. Further evaluation with ultrasound was performed.

Mammographic images were processed with CAD.

Targeted ultrasound is performed, showing an oval, circumscribed
hypoechoic mass at the 9 o'clock position 5 cm from the nipple. It
measures 4 x 4 x 2 mm. There is no internal vascularity. Extensive
evaluation of the remainder of the upper outer quadrant demonstrates
no additional suspicious findings.
IMPRESSION: Probably benign right breast mass, which may represent a complicated
cyst, may correspond with the screening abnormality. Recommendation
is for short-term imaging follow-up.

RECOMMENDATION:
Diagnostic right breast mammogram and ultrasound in 6 months.

I have discussed the findings and recommendations with the patient.
If applicable, a reminder letter will be sent to the patient
regarding the next appointment.

BI-RADS CATEGORY  3: Probably benign.

## 2022-08-03 DIAGNOSIS — Z23 Encounter for immunization: Secondary | ICD-10-CM | POA: Diagnosis not present

## 2022-08-22 DIAGNOSIS — M8580 Other specified disorders of bone density and structure, unspecified site: Secondary | ICD-10-CM | POA: Diagnosis not present

## 2022-08-22 DIAGNOSIS — Z1212 Encounter for screening for malignant neoplasm of rectum: Secondary | ICD-10-CM | POA: Diagnosis not present

## 2022-08-22 DIAGNOSIS — E785 Hyperlipidemia, unspecified: Secondary | ICD-10-CM | POA: Diagnosis not present

## 2022-08-22 DIAGNOSIS — E039 Hypothyroidism, unspecified: Secondary | ICD-10-CM | POA: Diagnosis not present

## 2022-08-22 DIAGNOSIS — F419 Anxiety disorder, unspecified: Secondary | ICD-10-CM | POA: Diagnosis not present

## 2022-08-22 DIAGNOSIS — I10 Essential (primary) hypertension: Secondary | ICD-10-CM | POA: Diagnosis not present

## 2022-08-28 DIAGNOSIS — N6019 Diffuse cystic mastopathy of unspecified breast: Secondary | ICD-10-CM | POA: Diagnosis not present

## 2022-08-28 DIAGNOSIS — E785 Hyperlipidemia, unspecified: Secondary | ICD-10-CM | POA: Diagnosis not present

## 2022-08-28 DIAGNOSIS — I10 Essential (primary) hypertension: Secondary | ICD-10-CM | POA: Diagnosis not present

## 2022-08-28 DIAGNOSIS — E039 Hypothyroidism, unspecified: Secondary | ICD-10-CM | POA: Diagnosis not present

## 2022-08-28 DIAGNOSIS — D126 Benign neoplasm of colon, unspecified: Secondary | ICD-10-CM | POA: Diagnosis not present

## 2022-08-28 DIAGNOSIS — R82998 Other abnormal findings in urine: Secondary | ICD-10-CM | POA: Diagnosis not present

## 2022-08-28 DIAGNOSIS — M8580 Other specified disorders of bone density and structure, unspecified site: Secondary | ICD-10-CM | POA: Diagnosis not present

## 2022-08-28 DIAGNOSIS — Z1331 Encounter for screening for depression: Secondary | ICD-10-CM | POA: Diagnosis not present

## 2022-08-28 DIAGNOSIS — I493 Ventricular premature depolarization: Secondary | ICD-10-CM | POA: Diagnosis not present

## 2022-08-28 DIAGNOSIS — Z0000 Encounter for general adult medical examination without abnormal findings: Secondary | ICD-10-CM | POA: Diagnosis not present

## 2022-08-28 DIAGNOSIS — Z1389 Encounter for screening for other disorder: Secondary | ICD-10-CM | POA: Diagnosis not present

## 2023-02-22 DIAGNOSIS — D225 Melanocytic nevi of trunk: Secondary | ICD-10-CM | POA: Diagnosis not present

## 2023-02-22 DIAGNOSIS — X32XXXA Exposure to sunlight, initial encounter: Secondary | ICD-10-CM | POA: Diagnosis not present

## 2023-02-22 DIAGNOSIS — L570 Actinic keratosis: Secondary | ICD-10-CM | POA: Diagnosis not present

## 2023-02-22 DIAGNOSIS — Z1283 Encounter for screening for malignant neoplasm of skin: Secondary | ICD-10-CM | POA: Diagnosis not present

## 2023-03-25 DIAGNOSIS — H35033 Hypertensive retinopathy, bilateral: Secondary | ICD-10-CM | POA: Diagnosis not present

## 2023-03-25 DIAGNOSIS — H2513 Age-related nuclear cataract, bilateral: Secondary | ICD-10-CM | POA: Diagnosis not present

## 2023-03-28 ENCOUNTER — Other Ambulatory Visit: Payer: Self-pay | Admitting: Endocrinology

## 2023-03-28 DIAGNOSIS — Z1231 Encounter for screening mammogram for malignant neoplasm of breast: Secondary | ICD-10-CM

## 2023-05-21 ENCOUNTER — Ambulatory Visit
Admission: RE | Admit: 2023-05-21 | Discharge: 2023-05-21 | Disposition: A | Discharge status: Home / Self Care | Payer: Medicare Other | Source: Ambulatory Visit | Attending: Endocrinology | Admitting: Endocrinology

## 2023-05-21 DIAGNOSIS — Z1231 Encounter for screening mammogram for malignant neoplasm of breast: Secondary | ICD-10-CM | POA: Diagnosis not present

## 2023-08-28 DIAGNOSIS — I10 Essential (primary) hypertension: Secondary | ICD-10-CM | POA: Diagnosis not present

## 2023-08-28 DIAGNOSIS — M8580 Other specified disorders of bone density and structure, unspecified site: Secondary | ICD-10-CM | POA: Diagnosis not present

## 2023-08-28 DIAGNOSIS — F419 Anxiety disorder, unspecified: Secondary | ICD-10-CM | POA: Diagnosis not present

## 2023-08-28 DIAGNOSIS — F900 Attention-deficit hyperactivity disorder, predominantly inattentive type: Secondary | ICD-10-CM | POA: Diagnosis not present

## 2023-08-28 DIAGNOSIS — Z79899 Other long term (current) drug therapy: Secondary | ICD-10-CM | POA: Diagnosis not present

## 2023-08-28 DIAGNOSIS — E039 Hypothyroidism, unspecified: Secondary | ICD-10-CM | POA: Diagnosis not present

## 2023-08-28 DIAGNOSIS — E785 Hyperlipidemia, unspecified: Secondary | ICD-10-CM | POA: Diagnosis not present

## 2023-09-13 DIAGNOSIS — I10 Essential (primary) hypertension: Secondary | ICD-10-CM | POA: Diagnosis not present

## 2023-09-13 DIAGNOSIS — E785 Hyperlipidemia, unspecified: Secondary | ICD-10-CM | POA: Diagnosis not present

## 2023-09-13 DIAGNOSIS — Z1331 Encounter for screening for depression: Secondary | ICD-10-CM | POA: Diagnosis not present

## 2023-09-13 DIAGNOSIS — I493 Ventricular premature depolarization: Secondary | ICD-10-CM | POA: Diagnosis not present

## 2023-09-13 DIAGNOSIS — Z1339 Encounter for screening examination for other mental health and behavioral disorders: Secondary | ICD-10-CM | POA: Diagnosis not present

## 2023-09-13 DIAGNOSIS — E039 Hypothyroidism, unspecified: Secondary | ICD-10-CM | POA: Diagnosis not present

## 2023-09-13 DIAGNOSIS — M8580 Other specified disorders of bone density and structure, unspecified site: Secondary | ICD-10-CM | POA: Diagnosis not present

## 2023-09-13 DIAGNOSIS — R82998 Other abnormal findings in urine: Secondary | ICD-10-CM | POA: Diagnosis not present

## 2023-09-13 DIAGNOSIS — G43909 Migraine, unspecified, not intractable, without status migrainosus: Secondary | ICD-10-CM | POA: Diagnosis not present

## 2023-09-13 DIAGNOSIS — F900 Attention-deficit hyperactivity disorder, predominantly inattentive type: Secondary | ICD-10-CM | POA: Diagnosis not present

## 2023-09-13 DIAGNOSIS — Z0000 Encounter for general adult medical examination without abnormal findings: Secondary | ICD-10-CM | POA: Diagnosis not present

## 2024-02-05 DIAGNOSIS — R051 Acute cough: Secondary | ICD-10-CM | POA: Diagnosis not present

## 2024-02-05 DIAGNOSIS — R062 Wheezing: Secondary | ICD-10-CM | POA: Diagnosis not present

## 2024-02-07 DIAGNOSIS — D225 Melanocytic nevi of trunk: Secondary | ICD-10-CM | POA: Diagnosis not present

## 2024-02-07 DIAGNOSIS — X32XXXD Exposure to sunlight, subsequent encounter: Secondary | ICD-10-CM | POA: Diagnosis not present

## 2024-02-07 DIAGNOSIS — Z1283 Encounter for screening for malignant neoplasm of skin: Secondary | ICD-10-CM | POA: Diagnosis not present

## 2024-02-07 DIAGNOSIS — L570 Actinic keratosis: Secondary | ICD-10-CM | POA: Diagnosis not present

## 2024-04-13 ENCOUNTER — Other Ambulatory Visit: Payer: Self-pay | Admitting: Endocrinology

## 2024-04-13 DIAGNOSIS — Z1231 Encounter for screening mammogram for malignant neoplasm of breast: Secondary | ICD-10-CM

## 2024-05-25 ENCOUNTER — Ambulatory Visit
Admission: RE | Admit: 2024-05-25 | Discharge: 2024-05-25 | Disposition: A | Discharge status: Home / Self Care | Source: Ambulatory Visit | Attending: Endocrinology | Admitting: Endocrinology

## 2024-05-25 DIAGNOSIS — Z1231 Encounter for screening mammogram for malignant neoplasm of breast: Secondary | ICD-10-CM | POA: Diagnosis not present

## 2024-09-13 DIAGNOSIS — Z1212 Encounter for screening for malignant neoplasm of rectum: Secondary | ICD-10-CM | POA: Diagnosis not present

## 2024-09-14 DIAGNOSIS — I10 Essential (primary) hypertension: Secondary | ICD-10-CM | POA: Diagnosis not present

## 2024-09-14 DIAGNOSIS — R82998 Other abnormal findings in urine: Secondary | ICD-10-CM | POA: Diagnosis not present

## 2024-09-18 ENCOUNTER — Other Ambulatory Visit: Payer: Self-pay | Admitting: Endocrinology

## 2024-09-18 DIAGNOSIS — I10 Essential (primary) hypertension: Secondary | ICD-10-CM

## 2024-09-29 ENCOUNTER — Ambulatory Visit
Admission: RE | Admit: 2024-09-29 | Discharge: 2024-09-29 | Disposition: A | Discharge status: Home / Self Care | Source: Ambulatory Visit | Attending: Endocrinology | Admitting: Endocrinology

## 2024-09-29 DIAGNOSIS — I10 Essential (primary) hypertension: Secondary | ICD-10-CM
